# Patient Record
Sex: Male | Born: 1935 | Race: White | Hispanic: No | Marital: Married | Smoking: Never smoker
Health system: Southern US, Community
[De-identification: ages and names within clinical notes are randomized; demographics above are authoritative.]

## PROBLEM LIST (undated history)

## (undated) DIAGNOSIS — K219 Gastro-esophageal reflux disease without esophagitis: Secondary | ICD-10-CM

## (undated) DIAGNOSIS — I10 Essential (primary) hypertension: Secondary | ICD-10-CM

## (undated) DIAGNOSIS — M1990 Unspecified osteoarthritis, unspecified site: Secondary | ICD-10-CM

## (undated) DIAGNOSIS — E079 Disorder of thyroid, unspecified: Secondary | ICD-10-CM

## (undated) HISTORY — DX: Unspecified osteoarthritis, unspecified site: M19.90

## (undated) HISTORY — PX: PACEMAKER INSERTION: SHX728

## (undated) HISTORY — DX: Gastro-esophageal reflux disease without esophagitis: K21.9

## (undated) HISTORY — PX: TOOTH EXTRACTION: SUR596

## (undated) HISTORY — DX: Disorder of thyroid, unspecified: E07.9

## (undated) HISTORY — DX: Essential (primary) hypertension: I10

## (undated) HISTORY — PX: ABLATION: SHX5711

---

## 1997-11-08 ENCOUNTER — Inpatient Hospital Stay (HOSPITAL_COMMUNITY): Admission: AD | Admit: 1997-11-08 | Discharge: 1997-11-11 | Payer: Self-pay | Admitting: Gastroenterology

## 1997-11-26 ENCOUNTER — Ambulatory Visit (HOSPITAL_COMMUNITY): Admission: RE | Admit: 1997-11-26 | Discharge: 1997-11-26 | Payer: Self-pay | Admitting: Gastroenterology

## 1998-06-18 ENCOUNTER — Inpatient Hospital Stay (HOSPITAL_COMMUNITY): Admission: EM | Admit: 1998-06-18 | Discharge: 1998-06-22 | Payer: Self-pay | Admitting: Emergency Medicine

## 1998-06-18 ENCOUNTER — Encounter: Payer: Self-pay | Admitting: Gastroenterology

## 1998-06-19 ENCOUNTER — Encounter: Payer: Self-pay | Admitting: Gastroenterology

## 2000-06-24 ENCOUNTER — Ambulatory Visit (HOSPITAL_COMMUNITY): Admission: RE | Admit: 2000-06-24 | Discharge: 2000-06-24 | Payer: Self-pay | Admitting: Gastroenterology

## 2002-12-13 ENCOUNTER — Ambulatory Visit (HOSPITAL_COMMUNITY): Admission: RE | Admit: 2002-12-13 | Discharge: 2002-12-13 | Payer: Self-pay | Admitting: Gastroenterology

## 2004-07-06 ENCOUNTER — Encounter: Admission: RE | Admit: 2004-07-06 | Discharge: 2004-07-06 | Payer: Self-pay | Admitting: Gastroenterology

## 2005-04-05 ENCOUNTER — Ambulatory Visit (HOSPITAL_COMMUNITY): Admission: RE | Admit: 2005-04-05 | Discharge: 2005-04-05 | Payer: Self-pay | Admitting: Gastroenterology

## 2012-09-14 ENCOUNTER — Other Ambulatory Visit: Payer: Self-pay | Admitting: Gastroenterology

## 2013-03-27 ENCOUNTER — Ambulatory Visit (INDEPENDENT_AMBULATORY_CARE_PROVIDER_SITE_OTHER): Payer: Medicare Other

## 2013-03-27 VITALS — BP 145/73 | HR 64 | Resp 16 | Ht 69.0 in | Wt 233.0 lb

## 2013-03-27 DIAGNOSIS — M79609 Pain in unspecified limb: Secondary | ICD-10-CM

## 2013-03-27 DIAGNOSIS — B351 Tinea unguium: Secondary | ICD-10-CM

## 2013-03-27 NOTE — Progress Notes (Signed)
  Subjective:    Patient ID: Nathan Vazquez, male    DOB: Aug 16, 1931, 79 y.o.   MRN: [id number]  "Just trim my toenails, that's about it."  HPI Comments: N  Painful, Thick L  Debridement B/L D 1 week ago O  Gradually  C  Gotten worse A  Get too long T  Attempted to cut them myself without much success      Review of Systems  Genitourinary: Positive for frequency and difficulty urinating.  Musculoskeletal:       Joint pain  Hematological: Bruises/bleeds easily.  Psychiatric/Behavioral: The patient is nervous/anxious.   All other systems reviewed and are negative.       Objective:   Physical Exam Neurovascular status is intact although minimally diminished DP pulses +2/4 PT one over 4 bilateral Refill timed 3-4 seconds all digits skin temperature warm turgor diminished no edema rubor pallor mild varicosities noted. Epicritic and Percocet to sensations intact and symmetric bilateral. Normal plantar response DTRs not listed dermatologic the skin color pigment normal nails thick brittle friable criptotic incurvated most notable hallux nails bilateral painful tender and symptomatic with friability and discomfort with enclosed shoe wear and ambulation. Remaining nails also show thickening and keratosis incurvation. Orthopedic exam unremarkable for digital contractures hammertoes 2 through 5. No open wounds or ulcerations are noted.       Assessment & Plan:  Assessment this time is onychomycosis thick and brittle friable mycotic nails debrided x10. The hallux second and third digits bilateral painful tender and symptomatic and friable consistent with onychomycosis. Nails debrided at this time also recommended topical antifungal either Fungi-Nail or formula 3 which to be obtained here applied twice daily to the affected nails for 12 months duration. Recheck in 3 months for continued palliative care is needed  Alvan Dame DPM

## 2013-03-27 NOTE — Patient Instructions (Signed)
Onychomycosis/Fungal Toenails  WHAT IS IT? An infection that lies within the keratin of your nail plate that is caused by a fungus.  WHY ME? Fungal infections affect all ages, sexes, races, and creeds.  There may be many factors that predispose you to a fungal infection such as age, coexisting medical conditions such as diabetes, or an autoimmune disease; stress, medications, fatigue, genetics, etc.  Bottom line: fungus thrives in a warm, moist environment and your shoes offer such a location.  IS IT CONTAGIOUS? Theoretically, yes.  You do not want to share shoes, nail clippers or files with someone who has fungal toenails.  Walking around barefoot in the same room or sleeping in the same bed is unlikely to transfer the organism.  It is important to realize, however, that fungus can spread easily from one nail to the next on the same foot.  HOW DO WE TREAT THIS?  There are several ways to treat this condition.  Treatment may depend on many factors such as age, medications, pregnancy, liver and kidney conditions, etc.  It is best to ask your doctor which options are available to you.  1. No treatment.   Unlike many other medical concerns, you can live with this condition.  However for many people this can be a painful condition and may lead to ingrown toenails or a bacterial infection.  It is recommended that you keep the nails cut short to help reduce the amount of fungal nail. 2. Topical treatment.  These range from herbal remedies to prescription strength nail lacquers.  About 40-50% effective, topicals require twice daily application for approximately 9 to 12 months or until an entirely new nail has grown out.  The most effective topicals are medical grade medications available through physicians offices. 3. Oral antifungal medications.  With an 80-90% cure rate, the most common oral medication requires 3 to 4 months of therapy and stays in your system for a year as the new nail grows out.  Oral  antifungal medications do require blood work to make sure it is a safe drug for you.  A liver function panel will be performed prior to starting the medication and after the first month of treatment.  It is important to have the blood work performed to avoid any harmful side effects.  In general, this medication safe but blood work is required. 4. Laser Therapy.  This treatment is performed by applying a specialized laser to the affected nail plate.  This therapy is noninvasive, fast, and non-painful.  It is not covered by insurance and is therefore, out of pocket.  The results have been very good with a 80-95% cure rate.  The [facility] is the only practice in the area to offer this therapy. 5. Permanent Nail Avulsion.  Removing the entire nail so that a new nail will not grow back. 6. Formula 3 which can be obtained at the office 3 applied twice daily to the affected nails for 12 months duration. Calces Fungi-Nail which can be obtained over-the-counter.

## 2018-05-10 ENCOUNTER — Other Ambulatory Visit: Payer: Self-pay | Admitting: Gastroenterology

## 2018-05-10 DIAGNOSIS — R1311 Dysphagia, oral phase: Secondary | ICD-10-CM

## 2018-05-11 ENCOUNTER — Ambulatory Visit
Admission: RE | Admit: 2018-05-11 | Discharge: 2018-05-11 | Disposition: A | Discharge status: Home / Self Care | Payer: Commercial Managed Care - HMO | Source: Ambulatory Visit | Attending: Gastroenterology | Admitting: Gastroenterology

## 2018-05-11 DIAGNOSIS — R1311 Dysphagia, oral phase: Secondary | ICD-10-CM

## 2018-05-17 ENCOUNTER — Other Ambulatory Visit: Payer: Self-pay | Admitting: Gastroenterology

## 2018-05-17 ENCOUNTER — Ambulatory Visit
Admission: RE | Admit: 2018-05-17 | Discharge: 2018-05-17 | Disposition: A | Discharge status: Home / Self Care | Payer: Commercial Managed Care - HMO | Source: Ambulatory Visit | Attending: Gastroenterology | Admitting: Gastroenterology

## 2018-05-17 DIAGNOSIS — R1311 Dysphagia, oral phase: Secondary | ICD-10-CM

## 2018-05-23 ENCOUNTER — Other Ambulatory Visit (HOSPITAL_COMMUNITY): Payer: Self-pay

## 2018-05-23 DIAGNOSIS — R1310 Dysphagia, unspecified: Secondary | ICD-10-CM

## 2018-06-09 ENCOUNTER — Ambulatory Visit (HOSPITAL_COMMUNITY)
Admission: RE | Admit: 2018-06-09 | Discharge: 2018-06-09 | Disposition: A | Discharge status: Home / Self Care | Payer: Medicare HMO | Source: Ambulatory Visit | Attending: Gastroenterology | Admitting: Gastroenterology

## 2018-06-09 DIAGNOSIS — R1310 Dysphagia, unspecified: Secondary | ICD-10-CM | POA: Diagnosis present

## 2018-06-09 NOTE — Progress Notes (Signed)
Modified Barium Swallow Progress Note  Patient Details  Name: Nathan Vazquez MRN: [id number] Date of Birth: [birth year]  Today's Date: 06/09/2018  Modified Barium Swallow completed.  Full report located under Chart Review in the Imaging Section.  Brief recommendations include the following:  Clinical Impression  Pt was seen in radiology suite for modified barium swallow study. He was seated upright in swallow chair for the study which was conducted in lateral view. Trials of puree, mechanical soft solids, thin liquids, nectar thick liquids, and a 62mm barium tablet in thin liquid were administered. He presents with moderate-severe pharyngeal phase dysphagia. He exhibited with reduced laryngeal excursion which resulted in incomplete epiglottic retroversion, and pyriform sinus residue across all consistencies. Trace pharyneal wall residue was noted due to reduced pharyngeal constriction. Penetration was demonstrated with idividual sips of thin liquids and nectar thick liquids, and aspiraton was observed with consecutive swallows secondary to incomplete epiglottic retroversion. Pt also presented with a curved epiglottis which, combined with the noted incomplete epiglottic retroversion, facilitated vallecular retention of solids. A liquid wash was effective in reducing pharyngeal residue. A chin tuck posture combined with a left head turn effectively eliminated penetration and aspiration. Coughing was noted following instances of aspiration but was ineffective in expellig the aspirant. A dysphagia 3 diet with thin liquids is recommended at this time with use of compensatory strategies to reduce aspiration risk. However, dysphagia intervention is clinically indicated to improve swallow function and reduce aspiration risk. Pt and his daughter were educated regarding the results of the study, diet recommendations, swallowing precautions, and the need for dysphagia intervention. Both parties verbalized  understanding as well as agreement with all areas of education.   Swallow Evaluation Recommendations       SLP Diet Recommendations: Dysphagia 3 (Mech soft) solids;Thin liquid   Liquid Administration via: No straw;Cup   Medication Administration: Whole meds with puree   Supervision: Intermittent supervision to cue for compensatory strategies   Compensations: Slow rate;Small sips/bites;Other (Comment);Follow solids with liquid;Chin tuck(chin tuck posture with left head turn )   Postural Changes: Remain semi-upright after after feeds/meals (Comment);Seated upright at 90 degrees   Oral Care Recommendations: Oral care BID      Danaysha Kirn I. Vear Clock, MS, CCC-SLP Acute Rehabilitation Services Office number [phone] Pager [phone]  Scheryl Marten 06/09/2018,3:08 PM

## 2018-06-29 ENCOUNTER — Other Ambulatory Visit: Payer: Self-pay | Admitting: Gastroenterology

## 2018-06-29 DIAGNOSIS — R1084 Generalized abdominal pain: Secondary | ICD-10-CM

## 2018-06-29 DIAGNOSIS — R110 Nausea: Secondary | ICD-10-CM

## 2018-06-29 DIAGNOSIS — R634 Abnormal weight loss: Secondary | ICD-10-CM

## 2018-07-06 ENCOUNTER — Ambulatory Visit
Admission: RE | Admit: 2018-07-06 | Discharge: 2018-07-06 | Disposition: A | Discharge status: Home / Self Care | Payer: Medicare HMO | Source: Ambulatory Visit | Attending: Gastroenterology | Admitting: Gastroenterology

## 2018-07-06 DIAGNOSIS — R1084 Generalized abdominal pain: Secondary | ICD-10-CM

## 2018-07-06 DIAGNOSIS — R634 Abnormal weight loss: Secondary | ICD-10-CM

## 2018-07-06 DIAGNOSIS — R110 Nausea: Secondary | ICD-10-CM

## 2018-07-06 MED ORDER — IOPAMIDOL (ISOVUE-300) INJECTION 61%
100.0000 mL | Freq: Once | INTRAVENOUS | Status: AC | PRN
  Administered 2018-07-06: 100 mL via INTRAVENOUS

## 2018-12-05 ENCOUNTER — Ambulatory Visit: Payer: Medicare HMO | Admitting: Speech Pathology

## 2019-05-08 ENCOUNTER — Other Ambulatory Visit: Payer: Self-pay

## 2019-05-08 ENCOUNTER — Encounter (HOSPITAL_COMMUNITY): Payer: Self-pay | Admitting: Emergency Medicine

## 2019-05-08 ENCOUNTER — Emergency Department (HOSPITAL_COMMUNITY): Payer: Medicare HMO

## 2019-05-08 ENCOUNTER — Inpatient Hospital Stay (HOSPITAL_COMMUNITY)
Admission: EM | Admit: 2019-05-08 | Discharge: 2019-05-12 | DRG: 177 | Disposition: A | Discharge status: Home Health | Payer: Medicare HMO | Source: Ambulatory Visit | Attending: Student | Admitting: Student

## 2019-05-08 DIAGNOSIS — E860 Dehydration: Secondary | ICD-10-CM | POA: Diagnosis present

## 2019-05-08 DIAGNOSIS — K219 Gastro-esophageal reflux disease without esophagitis: Secondary | ICD-10-CM | POA: Diagnosis present

## 2019-05-08 DIAGNOSIS — I951 Orthostatic hypotension: Secondary | ICD-10-CM | POA: Diagnosis present

## 2019-05-08 DIAGNOSIS — J1282 Pneumonia due to coronavirus disease 2019: Secondary | ICD-10-CM | POA: Diagnosis present

## 2019-05-08 DIAGNOSIS — I4821 Permanent atrial fibrillation: Secondary | ICD-10-CM | POA: Diagnosis not present

## 2019-05-08 DIAGNOSIS — U071 COVID-19: Principal | ICD-10-CM | POA: Diagnosis present

## 2019-05-08 DIAGNOSIS — Z820 Family history of epilepsy and other diseases of the nervous system: Secondary | ICD-10-CM | POA: Diagnosis not present

## 2019-05-08 DIAGNOSIS — J069 Acute upper respiratory infection, unspecified: Secondary | ICD-10-CM | POA: Diagnosis present

## 2019-05-08 DIAGNOSIS — G20 Parkinson's disease: Secondary | ICD-10-CM | POA: Diagnosis present

## 2019-05-08 DIAGNOSIS — R5381 Other malaise: Secondary | ICD-10-CM | POA: Diagnosis not present

## 2019-05-08 DIAGNOSIS — Z8551 Personal history of malignant neoplasm of bladder: Secondary | ICD-10-CM | POA: Diagnosis not present

## 2019-05-08 DIAGNOSIS — Z8546 Personal history of malignant neoplasm of prostate: Secondary | ICD-10-CM

## 2019-05-08 DIAGNOSIS — I10 Essential (primary) hypertension: Secondary | ICD-10-CM | POA: Diagnosis present

## 2019-05-08 DIAGNOSIS — E039 Hypothyroidism, unspecified: Secondary | ICD-10-CM | POA: Diagnosis present

## 2019-05-08 DIAGNOSIS — Z950 Presence of cardiac pacemaker: Secondary | ICD-10-CM

## 2019-05-08 DIAGNOSIS — Z7901 Long term (current) use of anticoagulants: Secondary | ICD-10-CM

## 2019-05-08 DIAGNOSIS — Z8679 Personal history of other diseases of the circulatory system: Secondary | ICD-10-CM

## 2019-05-08 DIAGNOSIS — I482 Chronic atrial fibrillation, unspecified: Secondary | ICD-10-CM | POA: Diagnosis not present

## 2019-05-08 DIAGNOSIS — I495 Sick sinus syndrome: Secondary | ICD-10-CM | POA: Diagnosis present

## 2019-05-08 DIAGNOSIS — D638 Anemia in other chronic diseases classified elsewhere: Secondary | ICD-10-CM | POA: Diagnosis present

## 2019-05-08 DIAGNOSIS — I4819 Other persistent atrial fibrillation: Secondary | ICD-10-CM | POA: Diagnosis present

## 2019-05-08 DIAGNOSIS — K2100 Gastro-esophageal reflux disease with esophagitis, without bleeding: Secondary | ICD-10-CM

## 2019-05-08 DIAGNOSIS — N400 Enlarged prostate without lower urinary tract symptoms: Secondary | ICD-10-CM | POA: Diagnosis present

## 2019-05-08 DIAGNOSIS — R55 Syncope and collapse: Secondary | ICD-10-CM | POA: Diagnosis present

## 2019-05-08 DIAGNOSIS — I959 Hypotension, unspecified: Secondary | ICD-10-CM | POA: Diagnosis present

## 2019-05-08 DIAGNOSIS — G2581 Restless legs syndrome: Secondary | ICD-10-CM | POA: Diagnosis present

## 2019-05-08 DIAGNOSIS — R531 Weakness: Secondary | ICD-10-CM | POA: Diagnosis not present

## 2019-05-08 LAB — CBC WITH DIFFERENTIAL/PLATELET
Abs Immature Granulocytes: 0.02 10*3/uL (ref 0.00–0.07)
Basophils Absolute: 0 10*3/uL (ref 0.0–0.1)
Basophils Relative: 0 %
Eosinophils Absolute: 0 10*3/uL (ref 0.0–0.5)
Eosinophils Relative: 1 %
HCT: 33.2 % — ABNORMAL LOW (ref 39.0–52.0)
Hemoglobin: 10.7 g/dL — ABNORMAL LOW (ref 13.0–17.0)
Immature Granulocytes: 1 %
Lymphocytes Relative: 15 %
Lymphs Abs: 0.6 10*3/uL — ABNORMAL LOW (ref 0.7–4.0)
MCH: 30.8 pg (ref 26.0–34.0)
MCHC: 32.2 g/dL (ref 30.0–36.0)
MCV: 95.7 fL (ref 80.0–100.0)
Monocytes Absolute: 0.4 10*3/uL (ref 0.1–1.0)
Monocytes Relative: 10 %
Neutro Abs: 3.1 10*3/uL (ref 1.7–7.7)
Neutrophils Relative %: 73 %
Platelets: 149 10*3/uL — ABNORMAL LOW (ref 150–400)
RBC: 3.47 MIL/uL — ABNORMAL LOW (ref 4.22–5.81)
RDW: 12.6 % (ref 11.5–15.5)
WBC: 4.2 10*3/uL (ref 4.0–10.5)
nRBC: 0 % (ref 0.0–0.2)

## 2019-05-08 LAB — COMPREHENSIVE METABOLIC PANEL
ALT: 14 U/L (ref 0–44)
AST: 17 U/L (ref 15–41)
Albumin: 3.7 g/dL (ref 3.5–5.0)
Alkaline Phosphatase: 76 U/L (ref 38–126)
Anion gap: 8 (ref 5–15)
BUN: 19 mg/dL (ref 8–23)
CO2: 27 mmol/L (ref 22–32)
Calcium: 8.4 mg/dL — ABNORMAL LOW (ref 8.9–10.3)
Chloride: 102 mmol/L (ref 98–111)
Creatinine, Ser: 1.09 mg/dL (ref 0.61–1.24)
GFR calc Af Amer: >60 mL/min (ref >60)
GFR calc non Af Amer: >60 mL/min (ref >60)
Glucose, Bld: 100 mg/dL — ABNORMAL HIGH (ref 70–99)
Potassium: 4.7 mmol/L (ref 3.5–5.1)
Sodium: 137 mmol/L (ref 135–145)
Total Bilirubin: 1.1 mg/dL (ref 0.3–1.2)
Total Protein: 6.3 g/dL — ABNORMAL LOW (ref 6.5–8.1)

## 2019-05-08 LAB — TRIGLYCERIDES: Triglycerides: 82 mg/dL (ref <150)

## 2019-05-08 LAB — URINALYSIS, ROUTINE W REFLEX MICROSCOPIC
Bacteria, UA: NONE SEEN
Bilirubin Urine: NEGATIVE
Glucose, UA: NEGATIVE mg/dL
Ketones, ur: NEGATIVE mg/dL
Leukocytes,Ua: NEGATIVE
Nitrite: NEGATIVE
Protein, ur: NEGATIVE mg/dL
Specific Gravity, Urine: 1.006 (ref 1.005–1.030)
pH: 7 (ref 5.0–8.0)

## 2019-05-08 LAB — LACTATE DEHYDROGENASE: LDH: 113 U/L (ref 98–192)

## 2019-05-08 LAB — POC SARS CORONAVIRUS 2 AG -  ED: SARS Coronavirus 2 Ag: POSITIVE — AB

## 2019-05-08 LAB — PROTIME-INR
INR: 1.6 — ABNORMAL HIGH (ref 0.8–1.2)
Prothrombin Time: 18.8 seconds — ABNORMAL HIGH (ref 11.4–15.2)

## 2019-05-08 LAB — LACTIC ACID, PLASMA: Lactic Acid, Venous: 0.9 mmol/L (ref 0.5–1.9)

## 2019-05-08 LAB — D-DIMER, QUANTITATIVE: D-Dimer, Quant: 0.33 ug/mL-FEU (ref 0.00–0.50)

## 2019-05-08 LAB — FIBRINOGEN: Fibrinogen: 397 mg/dL (ref 210–475)

## 2019-05-08 LAB — PROCALCITONIN: Procalcitonin: <0.1 ng/mL

## 2019-05-08 LAB — TROPONIN I (HIGH SENSITIVITY)
Troponin I (High Sensitivity): 6 ng/L (ref <18)
Troponin I (High Sensitivity): 6 ng/L (ref <18)

## 2019-05-08 MED ORDER — ROPINIROLE HCL 1 MG PO TABS
0.5000 mg | ORAL_TABLET | Freq: Three times a day (TID) | ORAL | Status: DC
  Administered 2019-05-08 – 2019-05-12 (×11): 0.5 mg via ORAL
  Filled 2019-05-08 (×13): qty 1

## 2019-05-08 MED ORDER — SODIUM CHLORIDE 0.9 % IV SOLN
100.0000 mg | Freq: Every day | INTRAVENOUS | Status: AC
  Administered 2019-05-09 – 2019-05-12 (×4): 100 mg via INTRAVENOUS
  Filled 2019-05-08 (×4): qty 100

## 2019-05-08 MED ORDER — ASCORBIC ACID 500 MG PO TABS
500.0000 mg | ORAL_TABLET | Freq: Every day | ORAL | Status: DC
  Administered 2019-05-08 – 2019-05-12 (×4): 500 mg via ORAL
  Filled 2019-05-08 (×5): qty 1

## 2019-05-08 MED ORDER — TAMSULOSIN HCL 0.4 MG PO CAPS
0.4000 mg | ORAL_CAPSULE | Freq: Every day | ORAL | Status: DC
  Administered 2019-05-08 – 2019-05-12 (×5): 0.4 mg via ORAL
  Filled 2019-05-08 (×5): qty 1

## 2019-05-08 MED ORDER — GUAIFENESIN-DM 100-10 MG/5ML PO SYRP
10.0000 mL | ORAL_SOLUTION | ORAL | Status: DC | PRN
  Administered 2019-05-10: 21:00:00 10 mL via ORAL
  Filled 2019-05-08: qty 10

## 2019-05-08 MED ORDER — ALBUTEROL SULFATE HFA 108 (90 BASE) MCG/ACT IN AERS
2.0000 | INHALATION_SPRAY | Freq: Four times a day (QID) | RESPIRATORY_TRACT | Status: DC
  Administered 2019-05-08 – 2019-05-12 (×12): 2 via RESPIRATORY_TRACT
  Filled 2019-05-08 (×3): qty 6.7

## 2019-05-08 MED ORDER — ZINC SULFATE 220 (50 ZN) MG PO CAPS
220.0000 mg | ORAL_CAPSULE | Freq: Every day | ORAL | Status: DC
  Administered 2019-05-08 – 2019-05-12 (×4): 220 mg via ORAL
  Filled 2019-05-08 (×5): qty 1

## 2019-05-08 MED ORDER — APIXABAN 5 MG PO TABS
5.0000 mg | ORAL_TABLET | Freq: Two times a day (BID) | ORAL | Status: DC
  Administered 2019-05-08 – 2019-05-12 (×8): 5 mg via ORAL
  Filled 2019-05-08 (×9): qty 1

## 2019-05-08 MED ORDER — SODIUM CHLORIDE 0.9 % IV SOLN
200.0000 mg | Freq: Once | INTRAVENOUS | Status: AC
  Administered 2019-05-08: 200 mg via INTRAVENOUS
  Filled 2019-05-08: qty 200

## 2019-05-08 MED ORDER — DILTIAZEM HCL ER COATED BEADS 180 MG PO CP24
180.0000 mg | ORAL_CAPSULE | Freq: Every day | ORAL | Status: DC
  Administered 2019-05-09 – 2019-05-12 (×4): 180 mg via ORAL
  Filled 2019-05-08 (×4): qty 1

## 2019-05-08 MED ORDER — DIPHENHYDRAMINE HCL 12.5 MG/5ML PO ELIX
6.2500 mg | ORAL_SOLUTION | Freq: Once | ORAL | Status: AC
  Administered 2019-05-08: 23:00:00 6.25 mg via ORAL
  Filled 2019-05-08: qty 5

## 2019-05-08 MED ORDER — SODIUM CHLORIDE 0.9 % IV SOLN: 200.0000 mg | Freq: Once | INTRAVENOUS | Status: DC

## 2019-05-08 MED ORDER — SODIUM CHLORIDE 0.9 % IV SOLN: INTRAVENOUS | Status: DC

## 2019-05-08 MED ORDER — PANTOPRAZOLE SODIUM 40 MG PO TBEC
40.0000 mg | DELAYED_RELEASE_TABLET | Freq: Every day | ORAL | Status: DC
  Administered 2019-05-08 – 2019-05-12 (×5): 40 mg via ORAL
  Filled 2019-05-08 (×5): qty 1

## 2019-05-08 MED ORDER — SODIUM CHLORIDE 0.9 % IV SOLN: 100.0000 mg | Freq: Every day | INTRAVENOUS | Status: DC

## 2019-05-08 NOTE — ED Triage Notes (Signed)
Arrives via EMS from the urgent care, lives at home. C/C weakness, lethargy and fatigue x3 weeks, lives with his wife. Caretakers are his sons, they are COVID +. Had an appt with urgent care today and initial BP was 82/54, UC placed an 18 LAC, gave 1L bolus NS, with EMS BP was 122/76, pacemaker with a hx of a-fib.

## 2019-05-08 NOTE — ED Provider Notes (Signed)
[place] DEPT Provider Note   CSN: [id number] Arrival date & time: 05/08/19  1228     History Chief Complaint  Patient presents with  . Fatigue    Nathan Vazquez is a 86 y.o. male.  Patient with a history of pacemaker 2/2 sick sinus syndrome, hypertension, Parkinson disease, A. fib on anticoagulation --presents to the emergency department today with hypotension and generalized weakness.  Patient states that he is currently living at home.  Patient has contact with family members who are Covid positive.  Patient has had increasing weakness, lethargy, and fatigue over the past 3 weeks.  Patient went to urgent care today and was found to have a blood pressure of 82/54.  EMS was called and patient was treated with 1 L of normal saline with improvement in blood pressure.  From Urgent Care:  86 y.o. male who has a past medical history of arthritis, A. fib on Eliquis, bladder cancer and prostate cancer, HTN, Parkinson's disease, SSS s/p PPM presenting to clinic today with 3 weeks of fatigue, weakness, body aches, and productive cough, in the setting of known Covid positive exposure. Patient is lethargic on exam, falling asleep during interview, feels "miserable", and was found to be hypotensive at 82/54 on manual blood pressure check. Blood sugar 85. IV placed and 1 L normal saline given.        Past Medical History:  Diagnosis Date  . Arthritis   . GERD (gastroesophageal reflux disease)   . Hypertension   . Thyroid disease     There are no problems to display for this patient.   Past Surgical History:  Procedure Laterality Date  . ABLATION     Heart  . PACEMAKER INSERTION    . TOOTH EXTRACTION Bilateral        Family History  Problem Relation Age of Onset  . Alzheimer's disease Father     Social History   Tobacco Use  . Smoking status: Never Smoker  Substance Use Topics  . Alcohol use: No  . Drug use: No    Home  Medications Prior to Admission medications   Medication Sig Start Date End Date Taking? Authorizing Provider  amiodarone (PACERONE) 200 MG tablet  03/18/13   [provider]  amLODipine (NORVASC) 10 MG tablet  03/18/13   [provider]  digoxin (LANOXIN) 0.125 MG tablet  03/09/13   [provider]  levothyroxine (SYNTHROID, LEVOTHROID) 50 MCG tablet  02/23/13   [provider]  meloxicam (MOBIC) 15 MG tablet  03/18/13   [provider]  omeprazole (PRILOSEC) 40 MG capsule  03/18/13   [provider]  warfarin (COUMADIN) 4 MG tablet  03/02/13   [provider]    Allergies    Patient has no known allergies.  Review of Systems   Review of Systems  Constitutional: Positive for fatigue. Negative for fever.  HENT: Negative for rhinorrhea and sore throat.   Eyes: Negative for redness.  Respiratory: Positive for cough. Negative for shortness of breath.   Cardiovascular: Negative for chest pain.  Gastrointestinal: Negative for abdominal pain, diarrhea, nausea and vomiting.  Genitourinary: Negative for dysuria.  Musculoskeletal: Negative for myalgias.  Skin: Negative for rash.  Neurological: Positive for weakness and light-headedness. Negative for headaches.    Physical Exam Updated Vital Signs BP 127/80 (BP Location: Right Arm)   Pulse 71   Temp 98.6 F (37 C) (Oral)   Resp 12   Ht 5\' 10"  (1.778 m)  Wt 69.9 kg   SpO2 97%   BMI 22.10 kg/m   Physical Exam Vitals and nursing note reviewed.  Constitutional:      Appearance: He is well-developed.  HENT:     Head: Normocephalic and atraumatic.     Mouth/Throat:     Mouth: Mucous membranes are dry.  Eyes:     General:        Right eye: No discharge.        Left eye: No discharge.     Conjunctiva/sclera: Conjunctivae normal.  Cardiovascular:     Rate and Rhythm: Normal rate and regular rhythm.     Heart sounds: Normal heart sounds.  Pulmonary:     Effort:  Pulmonary effort is normal.     Breath sounds: Normal breath sounds.  Abdominal:     Palpations: Abdomen is soft.     Tenderness: There is no abdominal tenderness. There is no guarding or rebound.  Musculoskeletal:     Cervical back: Normal range of motion and neck supple.  Skin:    General: Skin is warm and dry.  Neurological:     General: No focal deficit present.     Mental Status: He is alert. Mental status is at baseline.     Comments: Speech is garbled.  Patient moves all extremities but with decreased strength and generalized weakness.     ED Results / Procedures / Treatments   Labs (all labs ordered are listed, but only abnormal results are displayed) Labs Reviewed  CBC WITH DIFFERENTIAL/PLATELET - Abnormal; Notable for the following components:      Result Value   RBC 3.47 (*)    Hemoglobin 10.7 (*)    HCT 33.2 (*)    Platelets 149 (*)    Lymphs Abs 0.6 (*)    All other components within normal limits  COMPREHENSIVE METABOLIC PANEL - Abnormal; Notable for the following components:   Glucose, Bld 100 (*)    Calcium 8.4 (*)    Total Protein 6.3 (*)    All other components within normal limits  PROTIME-INR - Abnormal; Notable for the following components:   Prothrombin Time 18.8 (*)    INR 1.6 (*)    All other components within normal limits  POC SARS CORONAVIRUS 2 AG -  ED - Abnormal; Notable for the following components:   SARS Coronavirus 2 Ag POSITIVE (*)    All other components within normal limits  URINE CULTURE  CULTURE, BLOOD (ROUTINE X 2)  CULTURE, BLOOD (ROUTINE X 2)  LACTIC ACID, PLASMA  D-DIMER, QUANTITATIVE (NOT AT [facility])  PROCALCITONIN  LACTATE DEHYDROGENASE  TRIGLYCERIDES  FIBRINOGEN  URINALYSIS, ROUTINE W REFLEX MICROSCOPIC  LACTIC ACID, PLASMA  FERRITIN  C-REACTIVE PROTEIN  TROPONIN I (HIGH SENSITIVITY)  TROPONIN I (HIGH SENSITIVITY)    EKG EKG Interpretation  Date/Time:  Tuesday May 08 2019 13:00:20 EST Ventricular Rate:  70 PR  Interval:    QRS Duration: 169 QT Interval:  425 QTC Calculation: 459 R Axis:   -88 Text Interpretation: Atrial fibrillation Nonspecific IVCD with LAD Left ventricular hypertrophy No significant change since last tracing Confirmed by Melene Plan [phone]) on 05/08/2019 3:16:08 PM   Radiology DG Chest Port 1 View  Result Date: 05/08/2019 CLINICAL DATA:  Fatigue and weakness for the past 3 weeks. COVID exposure. EXAM: PORTABLE CHEST 1 VIEW COMPARISON:  Chest x-ray dated October 24, 2018. FINDINGS: Unchanged left chest wall pacemaker. Stable mild cardiomegaly. Atherosclerotic calcification of the aortic arch. Normal pulmonary vascularity. Minimal  bibasilar opacities. No pleural effusion or pneumothorax. No acute osseous abnormality. IMPRESSION: 1. Minimal bibasilar opacities which could reflect atelectasis or pneumonia, including atypical infection. Electronically Signed   By: Obie Dredge M.D.   On: 05/08/2019 14:35    Procedures Procedures (including critical care time)  Medications Ordered in ED Medications - No data to display  ED Course  I have reviewed the triage vital signs and the nursing notes.  Pertinent labs & imaging results that were available during my care of the patient were reviewed by me and considered in my medical decision making (see chart for details).    MDM Rules/Calculators/A&P                       Patient seen and examined. Work-up initiated. Medications ordered.   Vital signs reviewed and are as follows: BP 116/73   Pulse 73   Temp 98.6 F (37 C) (Oral)   Resp (!) 21   Ht 5\' 10"  (1.778 m)   Wt 69.9 kg   SpO2 99%   BMI 22.10 kg/m   1:47 PM COVID positive. Added on additional labs. Awaiting orthostatics and remainder of work-up.   2:42 PM CXR with mild covid pna. RN doing orthostatics and ambulation attempt now.   3:39 PM patient is severely weak and unsteady on his feet.  He cannot stand without assistance.  I spoke with the patient's daughter,  , by telephone.  She states that her parents can barely take care of themselves at baseline.  The patient's wife typically feeds him and assists him with ADLs.  Discussed that given patient's profound deconditioning from his coronavirus, feel that he should be admitted to the hospital.  She is in agreement.  Of note, patient's wife is currently in the emergency department with hypoxia and pneumonia in setting of coronavirus and is being admitted.  3:41 PM Dr. Jasmine December has spoken with hospitalist Dr. Adela Lank who will see and admit.   CLERANCE UMLAND was evaluated in Emergency Department on 05/08/2019 for the symptoms described in the history of present illness. He was evaluated in the context of the global COVID-19 pandemic, which necessitated consideration that the patient might be at risk for infection with the SARS-CoV-2 virus that causes COVID-19. Institutional protocols and algorithms that pertain to the evaluation of patients at risk for COVID-19 are in a state of rapid change based on information released by regulatory bodies including the CDC and federal and state organizations. These policies and algorithms were followed during the patient's care in the ED.    Final Clinical Impression(s) / ED Diagnoses Final diagnoses:  Near syncope  Pneumonia due to COVID-19 virus    Rx / DC Orders ED Discharge Orders    None       07/06/2019, PA-C 05/08/19 1541    07/06/19, DO 05/09/19 [phone]

## 2019-05-08 NOTE — ED Notes (Signed)
Patient unsteady on feet. Unable to ambulate with pulse ox.   Patient remained at 100% while standing for BP with assistance.

## 2019-05-08 NOTE — H&P (Signed)
History and Physical        Hospital Admission Note Date: 05/08/2019  Patient name: Nathan Vazquez Medical record number: [id number] Date of birth: 1931/10/21 Age: 86 y.o. Gender: male  PCP: Laqueta Due., MD    Patient coming from: Urgent care  I have reviewed all records in the [facility].    Chief Complaint:  Weakness, lethargy, fatigue for last 3 weeks.  Covid positive  HPI: Patient is a 86 year old male with history of hypertension, pacemaker secondary to sick sinus syndrome, Parkinson disease, chronic A. fib on anticoagulation was sent from urgent care today with generalized weakness and hypotension.  Patient and his wife both went to urgent care today to get checked out.  Patient's daughter has Covid and was taking her mother to the doctors appointments, other family members also has Covid.  Patient was experiencing increasing weakness, fatigue and lethargy over the past 2 weeks.  In the urgent care, patient was found to have BP of 82/54.  EMS was called and patient was brought to ED.  Patient reports no fevers or chills, nausea vomiting or diarrhea, just feeling very weak and dizzy. He does not report any acute shortness of breath. COVID-19 test positive Of note, patient's wife is also being admitted with similar symptoms.  ED work-up/course:  Temp 98.6, respiratory rate 16, BP 126/76, was orthostatic in ED and unsteady on ambulating.  BP dropped from lying 135/82 to standing 118/71. Sodium 137, BUN 19, creatinine 1.09, inflammatory markers normal, procalcitonin < 0.1 WBCs 4.2, hemoglobin 10.7, hematocrit 33.2 INR 1.6  Chest x-ray showed bibasilar opacities could reflect atelectasis or pneumonia including atypical infection  Review of Systems: Positives marked in 'bold' Constitutional: Denies fever, chills, diaphoresis, poor appetite and fatigue.  HEENT:  Denies photophobia, eye pain, redness, hearing loss, ear pain, congestion, sore throat, rhinorrhea, sneezing, mouth sores, trouble swallowing, neck pain, neck stiffness and tinnitus.   Respiratory: Denies SOB, DOE, cough, chest tightness,  and wheezing.   Cardiovascular: Denies chest pain, palpitations and leg swelling.  Gastrointestinal: Denies nausea, vomiting, abdominal pain, diarrhea, constipation, blood in stool and abdominal distention.  Genitourinary: Denies dysuria, urgency, frequency, hematuria, flank pain and difficulty urinating.  Musculoskeletal: Denies myalgias, back pain, joint swelling, arthralgias and gait problem.  Skin: Denies pallor, rash and wound.  Neurological: Very weak and unsteady, orthostatic and dizzy Hematological: Denies adenopathy. Easy bruising, personal or family bleeding history  Psychiatric/Behavioral: Denies suicidal ideation, mood changes, confusion, nervousness, sleep disturbance and agitation  Past Medical History: Past Medical History:  Diagnosis Date  . Arthritis   . GERD (gastroesophageal reflux disease)   . Hypertension   . Thyroid disease     Past Surgical History:  Procedure Laterality Date  . ABLATION     Heart  . PACEMAKER INSERTION    . TOOTH EXTRACTION Bilateral     Medications: Prior to Admission medications   Medication Sig Start Date End Date Taking? Authorizing Provider  geriatric multivitamins-minerals (ELDERTONIC/GEVRABON) LIQD Take 15 mLs by mouth 3 (three) times daily with meals. 07/31/18  Yes [provider]  amiodarone (PACERONE) 200 MG tablet  03/18/13   [provider]  amLODipine (NORVASC)  2.5 MG tablet Take 2.5 mg by mouth daily. 11/21/18   [provider]  digoxin (LANOXIN) 0.125 MG tablet  03/09/13   [provider]  diltiazem (CARDIZEM CD) 180 MG 24 hr capsule Take 180 mg by mouth daily. 03/29/19   [provider]  ELIQUIS 5 MG TABS tablet Take 5 mg by mouth 2 (two) times  daily. 05/06/19   [provider]  levothyroxine (SYNTHROID, LEVOTHROID) 50 MCG tablet  02/23/13   [provider]  LORazepam (ATIVAN) 0.5 MG tablet Take 0.5 mg by mouth every 8 (eight) hours as needed for agitation or anxiety. 04/05/19   [provider]  meloxicam (MOBIC) 15 MG tablet  03/18/13   [provider]  omeprazole (PRILOSEC) 40 MG capsule  03/18/13   [provider]  ondansetron (ZOFRAN-ODT) 4 MG disintegrating tablet Take 4 mg by mouth every 8 (eight) hours as needed for nausea/vomiting. 03/12/19   [provider]  pramipexole (MIRAPEX) 0.125 MG tablet Take 0.125 mg by mouth 3 (three) times daily. 05/07/19   [provider]  rOPINIRole (REQUIP) 0.5 MG tablet Take 0.5 mg by mouth 3 (three) times daily. 01/20/19   [provider]  tamsulosin (FLOMAX) 0.4 MG CAPS capsule Take 0.4 mg by mouth daily. 11/21/18   [provider]  telmisartan (MICARDIS) 80 MG tablet Take 80 mg by mouth daily. 02/05/19   [provider]  Vitamin D, Ergocalciferol, (DRISDOL) 1.25 MG (50000 UT) CAPS capsule Take 50,000 Units by mouth once a week. 01/22/19   [provider]  warfarin (COUMADIN) 4 MG tablet  03/02/13   [provider]    Allergies:  No Known Allergies  Social History:  reports that he has never smoked. He does not have any smokeless tobacco history on file. He reports that he does not drink alcohol or use drugs.  Family History: Family History  Problem Relation Age of Onset  . Alzheimer's disease Father     Physical Exam: Blood pressure 134/88, pulse 71, temperature 98.6 F (37 C), temperature source Oral, resp. rate 17, height 5\' 10"  (1.778 m), weight 69.9 kg, SpO2 100 %. General: Alert, awake, oriented x3, in no acute distress. Eyes: pink conjunctiva,anicteric sclera, pupils equal and reactive to light and accomodation, HEENT: normocephalic, atraumatic, oropharynx clear Neck: supple, no  masses or lymphadenopathy, no goiter, no bruits, no JVD CVS: Regular rate and rhythm, without murmurs, rubs or gallops. No lower extremity edema Resp : Decreased breath sound at the bases GI : Soft, nontender, nondistended, positive bowel sounds, no masses. No hepatomegaly. No hernia.  Musculoskeletal: No clubbing or cyanosis, positive pedal pulses. No contracture. ROM intact  Neuro: Grossly intact, no focal neurological deficits, strength 5/5 upper and lower extremities bilaterally Psych: alert and oriented x 3, normal mood and affect Skin: no rashes or lesions, warm and dry   LABS on Admission: I have personally reviewed all the labs and imagings below    Basic Metabolic Panel: Recent Labs  Lab 05/08/19 1259  NA 137  K 4.7  CL 102  CO2 27  GLUCOSE 100*  BUN 19  CREATININE 1.09  CALCIUM 8.4*   Liver Function Tests: Recent Labs  Lab 05/08/19 1259  AST 17  ALT 14  ALKPHOS 76  BILITOT 1.1  PROT 6.3*  ALBUMIN 3.7   No results for input(s): LIPASE, AMYLASE in the last 168 hours. No results for input(s): AMMONIA in the last 168 hours. CBC: Recent Labs  Lab 05/08/19 1259  WBC 4.2  NEUTROABS 3.1  HGB 10.7*  HCT 33.2*  MCV 95.7  PLT 149*   Cardiac Enzymes: No results for input(s): CKTOTAL, CKMB, CKMBINDEX, TROPONINI in the last 168 hours. BNP: Invalid input(s): POCBNP CBG: No results for input(s): GLUCAP in the last 168 hours.  Radiological Exams on Admission:  DG Chest Port 1 View  Result Date: 05/08/2019 CLINICAL DATA:  Fatigue and weakness for the past 3 weeks. COVID exposure. EXAM: PORTABLE CHEST 1 VIEW COMPARISON:  Chest x-ray dated October 24, 2018. FINDINGS: Unchanged left chest wall pacemaker. Stable mild cardiomegaly. Atherosclerotic calcification of the aortic arch. Normal pulmonary vascularity. Minimal bibasilar opacities. No pleural effusion or pneumothorax. No acute osseous abnormality. IMPRESSION: 1. Minimal bibasilar opacities which could reflect  atelectasis or pneumonia, including atypical infection. Electronically Signed   By: Obie Dredge M.D.   On: 05/08/2019 14:35      EKG: Independently reviewed.  Rate 70, A. fib, paced rhythm*   Assessment/Plan Principal Problem:   Acute COVID-19 viral illness - Patient presented with orthostasis, dizziness, generalized fatigue and lethargy, no acute shortness of breath.  Chest x-ray shows bibasilar opacities atelectasis or pneumonia including atypical infection. - Inflammatory markers are low, awaiting CRP. - Currently no hypoxia, hold off on Decadron - Patient appears to be dehydrated and is orthostatic, will give 1 L fluid - will place on remdesivir per pharmacy protocol - Continue vitamin C, zinc, Tylenol, inhalers  - Will need PT OT evaluation  Active Problems:   Hypotension -Likely due to #1, poor oral appetite in the last 2 weeks, dehydration -Continue to hold amlodipine and Micardis -Will restart diltiazem in am     Atrial fibrillation, chronic (HCC) -Currently rate controlled, will restart Cardizem in a.m. -Restart Eliquis    Debility -PT OT evaluation    H/O sick sinus syndrome status post pacemaker    BPH (benign prostatic hyperplasia) -Continue Flomax    GERD (gastroesophageal reflux disease) -Continue PPI  History of Parkinson's disease, RLS -Continue Requip  DVT prophylaxis: Eliquis  CODE STATUS: Full CODE STATUS, discussed with the patient  Consults called: None  Family Communication: Admission, patients condition and plan of care including tests being ordered have been discussed with the patient and daughter, Mercie Eon who indicates understanding and agree with the plan and Code Status  Admission status: Inpatient telemetry  The medical decision making on this patient was of high complexity and the patient is at high risk for clinical deterioration, therefore this is a level 3 admission.  Severity of Illness:      The appropriate patient  status for this patient is INPATIENT. Inpatient status is judged to be reasonable and necessary in order to provide the required intensity of service to ensure the patient's safety. The patient's presenting symptoms, physical exam findings, and initial radiographic and laboratory data in the context of their chronic comorbidities is felt to place them at high risk for further clinical deterioration. Furthermore, it is not anticipated that the patient will be medically stable for discharge from the hospital within 2 midnights of admission. The following factors support the patient status of inpatient.   " The patient's presenting symptoms include orthostatic, hypotensive, generalized debility, Covid positive " The worrisome physical exam findings include orthostatic " The initial radiographic and laboratory data are worrisome because of chest x-ray with pneumonia " The chronic co-morbidities include chronic atrial fibrillation, hypertension, advanced age   * I certify that at the point of admission it is my clinical judgment  that the patient will require inpatient hospital care spanning beyond 2 midnights from the point of admission due to high intensity of service, high risk for further deterioration and high frequency of surveillance required.*    Time Spent on Admission: 70 minutes     Andora Krull M.D. [facility] 05/08/2019, 3:48 PM

## 2019-05-09 ENCOUNTER — Other Ambulatory Visit: Payer: Self-pay

## 2019-05-09 DIAGNOSIS — G20 Parkinson's disease: Secondary | ICD-10-CM

## 2019-05-09 DIAGNOSIS — D638 Anemia in other chronic diseases classified elsewhere: Secondary | ICD-10-CM

## 2019-05-09 DIAGNOSIS — I951 Orthostatic hypotension: Secondary | ICD-10-CM

## 2019-05-09 DIAGNOSIS — Z8679 Personal history of other diseases of the circulatory system: Secondary | ICD-10-CM

## 2019-05-09 LAB — COMPREHENSIVE METABOLIC PANEL
ALT: 14 U/L (ref 0–44)
AST: 17 U/L (ref 15–41)
Albumin: 3.3 g/dL — ABNORMAL LOW (ref 3.5–5.0)
Alkaline Phosphatase: 77 U/L (ref 38–126)
Anion gap: 11 (ref 5–15)
BUN: 16 mg/dL (ref 8–23)
CO2: 24 mmol/L (ref 22–32)
Calcium: 8.7 mg/dL — ABNORMAL LOW (ref 8.9–10.3)
Chloride: 102 mmol/L (ref 98–111)
Creatinine, Ser: 0.86 mg/dL (ref 0.61–1.24)
GFR calc Af Amer: >60 mL/min (ref >60)
GFR calc non Af Amer: >60 mL/min (ref >60)
Glucose, Bld: 84 mg/dL (ref 70–99)
Potassium: 4.4 mmol/L (ref 3.5–5.1)
Sodium: 137 mmol/L (ref 135–145)
Total Bilirubin: 0.7 mg/dL (ref 0.3–1.2)
Total Protein: 6.2 g/dL — ABNORMAL LOW (ref 6.5–8.1)

## 2019-05-09 LAB — CBC WITH DIFFERENTIAL/PLATELET
Abs Immature Granulocytes: 0.02 10*3/uL (ref 0.00–0.07)
Basophils Absolute: 0 10*3/uL (ref 0.0–0.1)
Basophils Relative: 0 %
Eosinophils Absolute: 0 10*3/uL (ref 0.0–0.5)
Eosinophils Relative: 1 %
HCT: 32.8 % — ABNORMAL LOW (ref 39.0–52.0)
Hemoglobin: 10.7 g/dL — ABNORMAL LOW (ref 13.0–17.0)
Immature Granulocytes: 1 %
Lymphocytes Relative: 21 %
Lymphs Abs: 0.8 10*3/uL (ref 0.7–4.0)
MCH: 30.7 pg (ref 26.0–34.0)
MCHC: 32.6 g/dL (ref 30.0–36.0)
MCV: 94.3 fL (ref 80.0–100.0)
Monocytes Absolute: 0.4 10*3/uL (ref 0.1–1.0)
Monocytes Relative: 11 %
Neutro Abs: 2.3 10*3/uL (ref 1.7–7.7)
Neutrophils Relative %: 66 %
Platelets: 159 10*3/uL (ref 150–400)
RBC: 3.48 MIL/uL — ABNORMAL LOW (ref 4.22–5.81)
RDW: 12.6 % (ref 11.5–15.5)
WBC: 3.5 10*3/uL — ABNORMAL LOW (ref 4.0–10.5)
nRBC: 0 % (ref 0.0–0.2)

## 2019-05-09 LAB — C-REACTIVE PROTEIN: CRP: 2.3 mg/dL — ABNORMAL HIGH (ref <1.0)

## 2019-05-09 LAB — D-DIMER, QUANTITATIVE: D-Dimer, Quant: 0.29 ug/mL-FEU (ref 0.00–0.50)

## 2019-05-09 LAB — URINE CULTURE: Culture: NO GROWTH

## 2019-05-09 LAB — LACTIC ACID, PLASMA: Lactic Acid, Venous: 0.7 mmol/L (ref 0.5–1.9)

## 2019-05-09 LAB — FERRITIN: Ferritin: 231 ng/mL (ref 24–336)

## 2019-05-09 LAB — ABO/RH: ABO/RH(D): A POS

## 2019-05-09 MED ORDER — LORAZEPAM 0.5 MG PO TABS
0.5000 mg | ORAL_TABLET | Freq: Three times a day (TID) | ORAL | Status: DC | PRN
  Administered 2019-05-09 – 2019-05-11 (×3): 0.5 mg via ORAL
  Filled 2019-05-09 (×3): qty 1

## 2019-05-09 NOTE — ED Notes (Signed)
Pt provided breakfast tray.

## 2019-05-09 NOTE — ED Notes (Signed)
Pt prefers to take meds with applesauce. None available in dept. Pt did well with all meds, but refused zinc and vit c at this time.

## 2019-05-09 NOTE — Progress Notes (Signed)
Nathan NOTE  JES Vazquez TIW:[id number] DOB: [birth year]   PCP: Laqueta Due., MD  Patient is from: Home.  Lives with his wife who is also admitted for COVID-19 infection.  DOA: 05/08/2019 LOS: 1  Brief Narrative / Interim history: 86 year old male with history of HTN, sick sinus syndrome s/p PPM, A. fib on Eliquis, Parkinson's disease, HTN and hypothyroidism who was sent from urgent care with generalized weakness and hypotension.    In ED, tested positive for COVID-19.  Vitals within normal orthostatic vital positive.  No hypoxia.  CXR with bibasilar opacities.  Inflammatory markers elevated.  Procalcitonin negative.  Admitted for COVID-19 pneumonia, generalized weakness and orthostatic hypotension.  Started on remdesivir.  Subjective: No major events overnight or this morning.  Feels better.  Denies dyspnea, chest pain, cough, nausea, vomiting, diarrhea, abdominal pain or UTI symptoms.  Concern about his wife.  Objective: Vitals:   05/09/19 1000 05/09/19 1030 05/09/19 1100 05/09/19 1130  BP: 129/86 134/88 (!) 147/99 (!) 142/87  Pulse: 73 80 81 81  Resp: 15 17 15 13   Temp:      TempSrc:      SpO2: 96% 96% 97% 97%  Weight:      Height:        Intake/Output Summary (Last 24 hours) at 05/09/2019 1136 Last data filed at 05/09/2019 0225 Gross per 24 hour  Intake 250 ml  Output 1375 ml  Net -1125 ml   Filed Weights   05/08/19 1259  Weight: 69.9 kg    Examination:  GENERAL: No acute distress.  Appears well.  HEENT: MMM.  Vision and hearing grossly intact.  NECK: Supple.  No apparent JVD.  RESP:  No IWOB. Good air movement bilaterally. CVS: Irregular rhythm.  Normal rate. Heart sounds normal.  ABD/GI/GU: Bowel sounds present. Soft. Non tender.  MSK/EXT:  Moves extremities. No apparent deformity. No edema.  SKIN: no apparent skin lesion or wound NEURO: Awake, alert and oriented appropriately.  No apparent focal neuro deficit. PSYCH: Calm. Normal affect.  Procedures:   None  Assessment & Plan: Acute COVID-19 infection: CXR with bibasilar opacities.  CRP elevated to 2.3 markers within normal.  Pro-Cal negative.  No respiratory distress or oxygen requirement. Recent Labs    05/08/19 1259 05/09/19 0515  DDIMER 0.33 0.29  FERRITIN  --  231  LDH 113  --   CRP  --  2.3*  -Continue remdesivir 1/5>> -Already on Eliquis for A. fib. -Monitor inflammatory markers -Supportive care with Tylenol, vitamin C and zinc.  Orthostatic hypotension: patient presented with generalized weakness and dizziness.  Orthostatic vitals positive in ED.  Could have autonomic dysregulation due to Parkinson disease.  Covid infection could definitely contribute.  Reports improvement in his weakness and dizziness. -Hold home amlodipine and Micardis. -Recheck orthostatic vitals -Ted hose -PT/OT eval  Persistent atrial fibrillation: Rate controlled. -Continue Cardizem and Eliquis.  History of sick sinus syndrome s/p PPM  Anemia of chronic disease: H&H stable. -Continue monitoring  BPH without LUTS -Continue home Flomax.  GERD: -Continue PPI.  History of Parkinson's disease/RLS -Continue Requip and Mirapex  Debility -PT/OT eval              DVT prophylaxis: On Eliquis for A. fib. Code Status: Full code Family Communication: Patient and/or RN. Available if any question. Disposition Plan: Remains inpatient due to orthostatic hypotension Consultants: None   Microbiology summarized: COVID-19 positive.    Sch Meds:  Scheduled Meds: . albuterol  2 puff Inhalation Q6H  .  apixaban  5 mg Oral BID  . vitamin C  500 mg Oral Daily  . diltiazem  180 mg Oral Daily  . pantoprazole  40 mg Oral Daily  . rOPINIRole  0.5 mg Oral TID  . tamsulosin  0.4 mg Oral Daily  . zinc sulfate  220 mg Oral Daily   Continuous Infusions: . sodium chloride 100 mL/hr at 05/08/19 1750  . remdesivir 100 mg in NS 100 mL 100 mg (05/09/19 1106)   PRN  Meds:.guaiFENesin-dextromethorphan  Antimicrobials: Anti-infectives (From admission, onward)   Start     Dose/Rate Route Frequency Ordered Stop   05/09/19 1000  remdesivir 100 mg in sodium chloride 0.9 % 100 mL IVPB  Status:  Discontinued     100 mg 200 mL/hr over 30 Minutes Intravenous Daily 05/08/19 2156 05/08/19 2211   05/09/19 1000  remdesivir 100 mg in sodium chloride 0.9 % 100 mL IVPB     100 mg 200 mL/hr over 30 Minutes Intravenous Daily 05/08/19 1556 05/13/19 0959   05/08/19 2156  remdesivir 200 mg in sodium chloride 0.9% 250 mL IVPB  Status:  Discontinued     200 mg 580 mL/hr over 30 Minutes Intravenous Once 05/08/19 2156 05/08/19 2211   05/08/19 1630  remdesivir 200 mg in sodium chloride 0.9% 250 mL IVPB     200 mg 580 mL/hr over 30 Minutes Intravenous Once 05/08/19 1556 05/08/19 1932      I have personally reviewed the following labs and images: CBC: Recent Labs  Lab 05/08/19 1259 05/09/19 0515  WBC 4.2 3.5*  NEUTROABS 3.1 2.3  HGB 10.7* 10.7*  HCT 33.2* 32.8*  MCV 95.7 94.3  PLT 149* 159   BMP &GFR Recent Labs  Lab 05/08/19 1259 05/09/19 0515  NA 137 137  K 4.7 4.4  CL 102 102  CO2 27 24  GLUCOSE 100* 84  BUN 19 16  CREATININE 1.09 0.86  CALCIUM 8.4* 8.7*   Estimated Creatinine Clearance: 62.1 mL/min (by C-G formula based on SCr of 0.86 mg/dL). Liver & Pancreas: Recent Labs  Lab 05/08/19 1259 05/09/19 0515  AST 17 17  ALT 14 14  ALKPHOS 76 77  BILITOT 1.1 0.7  PROT 6.3* 6.2*  ALBUMIN 3.7 3.3*   No results for input(s): LIPASE, AMYLASE in the last 168 hours. No results for input(s): AMMONIA in the last 168 hours. Diabetic: No results for input(s): HGBA1C in the last 72 hours. No results for input(s): GLUCAP in the last 168 hours. Cardiac Enzymes: No results for input(s): CKTOTAL, CKMB, CKMBINDEX, TROPONINI in the last 168 hours. No results for input(s): PROBNP in the last 8760 hours. Coagulation Profile: Recent Labs  Lab 05/08/19 1259   INR 1.6*   Thyroid Function Tests: No results for input(s): TSH, T4TOTAL, FREET4, T3FREE, THYROIDAB in the last 72 hours. Lipid Profile: Recent Labs    05/08/19 1259  TRIG 82   Anemia Panel: Recent Labs    05/09/19 0515  FERRITIN 231   Urine analysis:    Component Value Date/Time   COLORURINE YELLOW 05/08/2019 1259   APPEARANCEUR CLEAR 05/08/2019 1259   LABSPEC 1.006 05/08/2019 1259   PHURINE 7.0 05/08/2019 1259   GLUCOSEU NEGATIVE 05/08/2019 1259   HGBUR SMALL (A) 05/08/2019 1259   BILIRUBINUR NEGATIVE 05/08/2019 1259   KETONESUR NEGATIVE 05/08/2019 1259   PROTEINUR NEGATIVE 05/08/2019 1259   NITRITE NEGATIVE 05/08/2019 1259   LEUKOCYTESUR NEGATIVE 05/08/2019 1259   Sepsis Labs: Invalid input(s): PROCALCITONIN, LACTICIDVEN  Microbiology: No results found  for this or any previous visit (from the past 240 hour(s)).  Radiology Studies: DG Chest Port 1 View  Result Date: 05/08/2019 CLINICAL DATA:  Fatigue and weakness for the past 3 weeks. COVID exposure. EXAM: PORTABLE CHEST 1 VIEW COMPARISON:  Chest x-ray dated October 24, 2018. FINDINGS: Unchanged left chest wall pacemaker. Stable mild cardiomegaly. Atherosclerotic calcification of the aortic arch. Normal pulmonary vascularity. Minimal bibasilar opacities. No pleural effusion or pneumothorax. No acute osseous abnormality. IMPRESSION: 1. Minimal bibasilar opacities which could reflect atelectasis or pneumonia, including atypical infection. Electronically Signed   By: Titus Dubin M.D.   On: 05/08/2019 14:35   35 minutes with more than 50% spent in reviewing records, counseling patient/family and coordinating care.  Meagen Limones T. [place]  If 7PM-7AM, please contact night-coverage [email or web address] Password [facility] 05/09/2019, 11:36 AM

## 2019-05-09 NOTE — ED Notes (Signed)
Offered pt to be repositioned in the bed, but pt states he is comfortable at this time. Pt has call bell within reach.

## 2019-05-09 NOTE — Evaluation (Signed)
Physical Therapy Evaluation Patient Details Name: Nathan Vazquez MRN: [id number] DOB: 1932-03-29 Today's Date: 05/09/2019   History of Present Illness  Pt is 86 year old male with history of HTN, sick sinus syndrome s/p PPM, A. fib on Eliquis, Parkinson's disease, HTN and hypothyroidism who was sent from urgent care with generalized weakness and hypotension.  Pt admitted with COVID 19 PNE, orthostatic hypotension.  Clinical Impression  Pt admitted with above diagnosis. Pt was able to transfer with min-mod A with PT and take a few steps.  He was limited by orthostatic hypotension and elevated HR.  O2 sats were stable on RA.  Pt alert and oriented and able to state safety concerns (doesn't get in shower b/c FOF, plans to start using RW). Pt normally independent and with good rehab potential; hopeful for ability to d/c home with family support (pt reports can get 24 hr assist). Pt currently with functional limitations due to the deficits listed below (see PT Problem List). Pt will benefit from skilled PT to increase their independence and safety with mobility to allow discharge to the venue listed below.       Follow Up Recommendations Home health PT;Supervision/Assistance - 24 hour    Equipment Recommendations  3in1 (PT)    Recommendations for Other Services       Precautions / Restrictions Precautions Precautions: Fall Restrictions Weight Bearing Restrictions: No      Mobility  Bed Mobility Overal bed mobility: Needs Assistance Bed Mobility: Supine to Sit;Sit to Supine     Supine to sit: Min assist Sit to supine: Min assist   General bed mobility comments: increased time ; HOB elevated; assist for LE and to boost trunk  Transfers Overall transfer level: Needs assistance Equipment used: Rolling walker (2 wheeled) Transfers: Sit to/from Stand Sit to Stand: From elevated surface;Min assist         General transfer comment: cues for safe hand  placement  Ambulation/Gait Ambulation/Gait assistance: Min assist Gait Distance (Feet): 2 Feet Assistive device: Rolling walker (2 wheeled) Gait Pattern/deviations: Decreased stride length;Shuffle     General Gait Details: side steps to [facility]; posterior lean; no further ambulation due to nursing called in and reports telemetry states HR elevated  Stairs            Wheelchair Mobility    Modified Rankin (Stroke Patients Only)       Balance Overall balance assessment: Needs assistance Sitting-balance support: Bilateral upper extremity supported;Feet supported Sitting balance-Leahy Scale: Fair     Standing balance support: Bilateral upper extremity supported;During functional activity Standing balance-Leahy Scale: Poor Standing balance comment: posterior lean; depending on bed behind legs for balance                             Pertinent Vitals/Pain Pain Assessment: No/denies pain   On RA with O2 sats 97-98% HR reading with PT were 70-90 bpm; however, nursing called in and reports telemetry called and HR elevated BP as follows: Orthostatic BPs  Supine 128/84  Sitting 103/80 (mild symptoms)     Standing 96/68 (mild symptoms)  Standing after 3 min 115/60 (reports feeling weak)       Home Living Family/patient expects to be discharged to:: Private residence Living Arrangements: Spouse/significant other Available Help at Discharge: Family Type of Home: House Home Access: Stairs to enter Entrance Stairs-Rails: Right Entrance Stairs-Number of Steps: 1 Home Layout: One level Home Equipment: Walker - 2 wheels(3 wheel walker)  Prior Function Level of Independence: Independent         Comments: Reports independent with ADLs and some IADLs; does report only does sponge baths because afraid to get in tub/shower; can ambulate in community     Hand Dominance        Extremity/Trunk Assessment   Upper Extremity Assessment Upper Extremity  Assessment: (Bil UE 4+/5 strength; ROM WFL)    Lower Extremity Assessment Lower Extremity Assessment: (Bil LE ROM WFL; MMT 4+/5; decreased coordination with toe tapping)    Cervical / Trunk Assessment Cervical / Trunk Assessment: Normal  Communication      Cognition Arousal/Alertness: Awake/alert Behavior During Therapy: WFL for tasks assessed/performed Overall Cognitive Status: Within Functional Limits for tasks assessed                                 General Comments: aware of current events      General Comments      Exercises     Assessment/Plan    PT Assessment Patient needs continued PT services  PT Problem List Decreased strength;Decreased mobility;Decreased activity tolerance;Cardiopulmonary status limiting activity;Decreased balance;Decreased knowledge of use of DME       PT Treatment Interventions DME instruction;Therapeutic activities;Gait training;Therapeutic exercise;Patient/family education;Stair training;Balance training;Functional mobility training    PT Goals (Current goals can be found in the Care Plan section)  Acute Rehab PT Goals Patient Stated Goal: return home if able PT Goal Formulation: With patient Time For Goal Achievement: 05/23/19 Potential to Achieve Goals: Good    Frequency Min 3X/week   Barriers to discharge        Co-evaluation               AM-PAC PT "6 Clicks" Mobility  Outcome Measure Help needed turning from your back to your side while in a flat bed without using bedrails?: A Little Help needed moving from lying on your back to sitting on the side of a flat bed without using bedrails?: A Lot Help needed moving to and from a bed to a chair (including a wheelchair)?: A Lot Help needed standing up from a chair using your arms (e.g., wheelchair or bedside chair)?: A Little Help needed to walk in hospital room?: A Lot Help needed climbing 3-5 steps with a railing? : A Lot 6 Click Score: 14    End of  Session Equipment Utilized During Treatment: Gait belt Activity Tolerance: Patient tolerated treatment well Patient left: in bed;with bed alarm set;with call bell/phone within reach Nurse Communication: Mobility status PT Visit Diagnosis: Unsteadiness on feet (R26.81);Other abnormalities of gait and mobility (R26.89);Muscle weakness (generalized) (M62.81)    Time: 9702-6378 PT Time Calculation (min) (ACUTE ONLY): 28 min   Charges:   PT Evaluation $PT Eval Moderate Complexity: 1 Mod          Maggie Font, PT Acute Rehab Services Pager [phone] [facility] [phone] [facility] [phone]   Karlton Lemon 05/09/2019, 4:41 PM

## 2019-05-09 NOTE — ED Notes (Signed)
Pt provided popsicle

## 2019-05-09 NOTE — Plan of Care (Signed)
Updated patient's son over the phone. 

## 2019-05-10 ENCOUNTER — Other Ambulatory Visit: Payer: Self-pay

## 2019-05-10 DIAGNOSIS — R531 Weakness: Secondary | ICD-10-CM

## 2019-05-10 LAB — CBC WITH DIFFERENTIAL/PLATELET
Abs Immature Granulocytes: 0.01 10*3/uL (ref 0.00–0.07)
Basophils Absolute: 0 10*3/uL (ref 0.0–0.1)
Basophils Relative: 0 %
Eosinophils Absolute: 0 10*3/uL (ref 0.0–0.5)
Eosinophils Relative: 1 %
HCT: 33.2 % — ABNORMAL LOW (ref 39.0–52.0)
Hemoglobin: 10.7 g/dL — ABNORMAL LOW (ref 13.0–17.0)
Immature Granulocytes: 0 %
Lymphocytes Relative: 16 %
Lymphs Abs: 0.7 10*3/uL (ref 0.7–4.0)
MCH: 30.7 pg (ref 26.0–34.0)
MCHC: 32.2 g/dL (ref 30.0–36.0)
MCV: 95.1 fL (ref 80.0–100.0)
Monocytes Absolute: 0.5 10*3/uL (ref 0.1–1.0)
Monocytes Relative: 11 %
Neutro Abs: 3 10*3/uL (ref 1.7–7.7)
Neutrophils Relative %: 72 %
Platelets: 169 10*3/uL (ref 150–400)
RBC: 3.49 MIL/uL — ABNORMAL LOW (ref 4.22–5.81)
RDW: 12.5 % (ref 11.5–15.5)
WBC: 4.2 10*3/uL (ref 4.0–10.5)
nRBC: 0 % (ref 0.0–0.2)

## 2019-05-10 LAB — COMPREHENSIVE METABOLIC PANEL
ALT: 13 U/L (ref 0–44)
AST: 16 U/L (ref 15–41)
Albumin: 3.1 g/dL — ABNORMAL LOW (ref 3.5–5.0)
Alkaline Phosphatase: 67 U/L (ref 38–126)
Anion gap: 10 (ref 5–15)
BUN: 16 mg/dL (ref 8–23)
CO2: 25 mmol/L (ref 22–32)
Calcium: 8.4 mg/dL — ABNORMAL LOW (ref 8.9–10.3)
Chloride: 98 mmol/L (ref 98–111)
Creatinine, Ser: 0.76 mg/dL (ref 0.61–1.24)
GFR calc Af Amer: >60 mL/min (ref >60)
GFR calc non Af Amer: >60 mL/min (ref >60)
Glucose, Bld: 92 mg/dL (ref 70–99)
Potassium: 4 mmol/L (ref 3.5–5.1)
Sodium: 133 mmol/L — ABNORMAL LOW (ref 135–145)
Total Bilirubin: 0.8 mg/dL (ref 0.3–1.2)
Total Protein: 5.9 g/dL — ABNORMAL LOW (ref 6.5–8.1)

## 2019-05-10 LAB — C-REACTIVE PROTEIN: CRP: 1.8 mg/dL — ABNORMAL HIGH (ref <1.0)

## 2019-05-10 LAB — FERRITIN: Ferritin: 246 ng/mL (ref 24–336)

## 2019-05-10 LAB — D-DIMER, QUANTITATIVE: D-Dimer, Quant: 0.41 ug/mL-FEU (ref 0.00–0.50)

## 2019-05-10 MED ORDER — PREDNISONE 10 MG PO TABS: 30.0000 mg | ORAL_TABLET | Freq: Every day | ORAL | 0 refills | Status: DC

## 2019-05-10 MED ORDER — TELMISARTAN 40 MG PO TABS: 40.0000 mg | ORAL_TABLET | Freq: Every day | ORAL | 1 refills | Status: DC

## 2019-05-10 MED ORDER — MAGNESIUM HYDROXIDE 400 MG/5ML PO SUSP: 10.0000 mL | Freq: Every day | ORAL | Status: DC | PRN

## 2019-05-10 NOTE — Progress Notes (Signed)
Physical Therapy Treatment Patient Details Name: Nathan Vazquez MRN: [id number] DOB: 19-May-1931 Today's Date: 05/10/2019    History of Present Illness Pt is 86 year old male with history of HTN, sick sinus syndrome s/p PPM, A. fib on Eliquis, Parkinson's disease, HTN and hypothyroidism who was sent from urgent care with generalized weakness and hypotension.  Pt admitted with COVID 19 PNE, orthostatic hypotension.    PT Comments    Patient progressing well with therapy and increased ambulation distance in hospital room. Pt on RA thorughout session and SpO2 remained 98-100% throughout session. Pt required min assist for sit<>stand transfers from recliner and toilet with cues for safe hand placement. Initially on standing and during gait pt with slight posterior lean and required min assist on 3 bouts to prevent LOB. As pt continues to require min assist to prevent falls while mobilizing and does not have 24/7 assist at home he will benefit from additional skilled PT interventions in SNF setting to improve independence with functional mobility. Acute PT will continue to progress pt as able.    Follow Up Recommendations  Supervision/Assistance - 24 hour;SNF     Equipment Recommendations  3in1 (PT)    Recommendations for Other Services       Precautions / Restrictions Precautions Precautions: Fall Restrictions Weight Bearing Restrictions: No    Mobility  Bed Mobility Overal bed mobility: Needs Assistance Bed Mobility: Sit to Supine     Supine to sit: Min assist Sit to supine: Min assist   General bed mobility comments: cues to sequence lowering trunk and raising LE's onto bed, assist for lifting LE's onto bed. verbal/tactile cues for use of bed rails and brdiging to scoot hips up and reposition in bed.  Transfers Overall transfer level: Needs assistance Equipment used: Rolling walker (2 wheeled) Transfers: Sit to/from Stand Sit to Stand: Min assist Stand pivot transfers: Min  assist       General transfer comment: verbal/tactile cues for safe hand placement and technique with RW, assist to initiate power up from bedside recliner and toilet. cues for safe rech back to grab bars on toilet and to EOB. assist to rise on each stand and cues to facilitate upright posture, assist to steady.  Ambulation/Gait Ambulation/Gait assistance: Min assist Gait Distance (Feet): 35 Feet(multile loops in hospital room and into bathroom) Assistive device: Rolling walker (2 wheeled) Gait Pattern/deviations: Decreased stride length;Shuffle;Narrow base of support;Trunk flexed Gait velocity: decreased   General Gait Details: min assist to steady with gait for walking in hospital room, pt with 2 episodes of LOB requiring assist to regain. pt ambulated into bathroom as well.   Stairs             Wheelchair Mobility    Modified Rankin (Stroke Patients Only)       Balance Overall balance assessment: Needs assistance Sitting-balance support: Bilateral upper extremity supported;Feet supported Sitting balance-Leahy Scale: Fair Sitting balance - Comments: pt required assist for pericare in bathroom Postural control: Posterior lean Standing balance support: Bilateral upper extremity supported;During functional activity Standing balance-Leahy Scale: Poor Standing balance comment: posterior lean           Cognition Arousal/Alertness: Awake/alert Behavior During Therapy: WFL for tasks assessed/performed Overall Cognitive Status: Within Functional Limits for tasks assessed          General Comments: aware of current events; follows multistep commands      Exercises      General Comments        Pertinent Vitals/Pain Pain Assessment: No/denies  pain    Home Living Family/patient expects to be discharged to:: Private residence Living Arrangements: Spouse/significant other Available Help at Discharge: Family Type of Home: House Home Access: Stairs to  enter Entrance Stairs-Rails: Right Home Layout: One level Home Equipment: Environmental consultant - 2 wheels(3 wheel walker)      Prior Function Level of Independence: Independent      Comments: Reports independent with ADLs and some IADLs; does report only does sponge baths because afraid to get in tub/shower; can ambulate in community   PT Goals (current goals can now be found in the care plan section) Acute Rehab PT Goals Patient Stated Goal: return home if able PT Goal Formulation: With patient Time For Goal Achievement: 05/23/19 Potential to Achieve Goals: Good Progress towards PT goals: Progressing toward goals    Frequency    Min 3X/week      PT Plan Current plan remains appropriate       AM-PAC PT "6 Clicks" Mobility   Outcome Measure  Help needed turning from your back to your side while in a flat bed without using bedrails?: A Little Help needed moving from lying on your back to sitting on the side of a flat bed without using bedrails?: A Lot Help needed moving to and from a bed to a chair (including a wheelchair)?: A Lot Help needed standing up from a chair using your arms (e.g., wheelchair or bedside chair)?: A Little Help needed to walk in hospital room?: A Lot Help needed climbing 3-5 steps with a railing? : A Lot 6 Click Score: 14    End of Session Equipment Utilized During Treatment: Gait belt Activity Tolerance: Patient tolerated treatment well Patient left: in bed;with bed alarm set;with call bell/phone within reach Nurse Communication: Mobility status PT Visit Diagnosis: Unsteadiness on feet (R26.81);Other abnormalities of gait and mobility (R26.89);Muscle weakness (generalized) (M62.81)     Time: 9528-4132 PT Time Calculation (min) (ACUTE ONLY): 33 min  Charges:  $Gait Training: 8-22 mins $Therapeutic Activity: 8-22 mins                     Verner Mould, DPT Physical Therapist with [facility] [phone]  05/10/2019 5:30  PM

## 2019-05-10 NOTE — Plan of Care (Signed)

## 2019-05-10 NOTE — TOC Progression Note (Signed)
Transition of Care [facility]) - Progression Note    Patient Details  Name: Nathan Vazquez MRN: [id number] Date of Birth: 15-Dec-1931  [facility] CM/SW Contact  Geni Bers, RN Phone Number: 05/10/2019, 4:10 PM  Clinical Narrative:    Spoke with pt concerning HH. Pt referred CM to Son and daughter. Spoke with both adult Childrens. Daughter agreed with pt going to SNF.         Expected Discharge Plan and Services           Expected Discharge Date: 05/10/19                                     Social Determinants of Health (SDOH) Interventions    Readmission Risk Interventions No flowsheet data found.

## 2019-05-10 NOTE — NC FL2 (Signed)
[place] LEVEL OF CARE SCREENING TOOL     IDENTIFICATION  Patient Name: Nathan Vazquez Birthdate: 11/17/1931 Sex: male Admission Date (Current Location): 05/08/2019  [facility] and [place] Number:  Herbalist and Address:  [facility],  [place] [address] [place]      Provider Number: [id number]  Attending Physician Name and Address:  Mercy Riding, MD  Relative Name and Phone Number:  Temple Sporer [phone]    Current Level of Care: Hospital Recommended Level of Care: [place] Prior Approval Number:    Date Approved/Denied:   PASRR Number:    Discharge Plan: SNF    Current Diagnoses: Patient Active Problem List   Diagnosis Date Noted  . Acute respiratory disease due to COVID-19 virus 05/08/2019  . Hypotension 05/08/2019  . Atrial fibrillation, chronic ([place]) 05/08/2019  . Parkinson's disease ([place]) 05/08/2019  . Debility 05/08/2019  . H/O sick sinus syndrome 05/08/2019  . BPH (benign prostatic hyperplasia) 05/08/2019  . GERD (gastroesophageal reflux disease) 05/08/2019    Orientation RESPIRATION BLADDER Height & Weight     Self, Time  Normal Continent, External catheter Weight: 73.2 kg Height:  5\' 9"  (175.3 cm)  BEHAVIORAL SYMPTOMS/MOOD NEUROLOGICAL BOWEL NUTRITION STATUS      Continent Diet(Heart Healthy)  AMBULATORY STATUS COMMUNICATION OF NEEDS Skin   Extensive Assist Verbally Normal                       Personal Care Assistance Level of Assistance  Bathing, Feeding, Dressing Bathing Assistance: Limited assistance Feeding assistance: Independent Dressing Assistance: Limited assistance     Functional Limitations Info  Sight, Hearing, Speech Sight Info: Adequate Hearing Info: Adequate Speech Info: Impaired    SPECIAL CARE FACTORS FREQUENCY  PT (By licensed PT), OT (By licensed OT)     PT Frequency: Eval and Treat OT Frequency: Eval and Treat            Contractures       Additional Factors Info  Code Status, Allergies Code Status Info: FULL Allergies Info: Penicillin G           Current Medications (05/10/2019):  This is the current hospital active medication list Current Facility-Administered Medications  Medication Dose Route Frequency Provider Last Rate Last Admin  . 0.9 %  sodium chloride infusion   Intravenous Continuous Rai, Ripudeep K, MD 100 mL/hr at 05/08/19 1750 New Bag at 05/08/19 1750  . albuterol (VENTOLIN HFA) 108 (90 Base) MCG/ACT inhaler 2 puff  2 puff Inhalation Q6H Rai, Ripudeep K, MD   2 puff at 05/10/19 0828  . apixaban (ELIQUIS) tablet 5 mg  5 mg Oral BID Rai, Ripudeep K, MD   5 mg at 05/10/19 0828  . ascorbic acid (VITAMIN C) tablet 500 mg  500 mg Oral Daily Rai, Ripudeep K, MD   500 mg at 05/10/19 0827  . diltiazem (CARDIZEM CD) 24 hr capsule 180 mg  180 mg Oral Daily Rai, Ripudeep K, MD   180 mg at 05/10/19 0827  . guaiFENesin-dextromethorphan (ROBITUSSIN DM) 100-10 MG/5ML syrup 10 mL  10 mL Oral Q4H PRN Rai, Ripudeep K, MD      . LORazepam (ATIVAN) tablet 0.5 mg  0.5 mg Oral Q8H PRN Wendee Beavers T, MD   0.5 mg at 05/09/19 1733  . pantoprazole (PROTONIX) EC tablet 40 mg  40 mg Oral Daily Rai, Ripudeep K, MD   40 mg at 05/10/19 0827  .  remdesivir 100 mg in sodium chloride 0.9 % 100 mL IVPB  100 mg Intravenous Daily Poindexter, Leann T, RPH 200 mL/hr at 05/10/19 1044 100 mg at 05/10/19 1044  . rOPINIRole (REQUIP) tablet 0.5 mg  0.5 mg Oral TID Rai, Ripudeep K, MD   0.5 mg at 05/10/19 0827  . tamsulosin (FLOMAX) capsule 0.4 mg  0.4 mg Oral Daily Rai, Ripudeep K, MD   0.4 mg at 05/10/19 0827  . zinc sulfate capsule 220 mg  220 mg Oral Daily Rai, Ripudeep K, MD   220 mg at 05/10/19 0827     Discharge Medications: Please see discharge summary for a list of discharge medications.  Relevant Imaging Results:  Relevant Lab Results:   Additional Information FV#[id number]  Geni Bers, RN

## 2019-05-10 NOTE — Progress Notes (Signed)
Called dietary and added scrambled eggs to patient's breakfast tray per patient request.

## 2019-05-10 NOTE — Progress Notes (Signed)
PROGRESS NOTE  Nathan Vazquez QMG:[id number] DOB: 09/26/1931   PCP: Karleen Hampshire., MD  Patient is from: Home.  Lives with his wife who is also admitted for COVID-19 infection.  DOA: 05/08/2019 LOS: 2  Brief Narrative / Interim history: 86 year old male with history of HTN, sick sinus syndrome s/p PPM, A. fib on Eliquis, Parkinson's disease, HTN and hypothyroidism who was sent from urgent care with generalized weakness and hypotension.    In ED, tested positive for COVID-19.  Vitals within normal orthostatic vital positive.  No hypoxia.  CXR with bibasilar opacities.  Inflammatory markers elevated.  Procalcitonin negative.  Admitted for COVID-19 pneumonia, generalized weakness and orthostatic hypotension.  Started on remdesivir.   Subjective: No major events overnight of this morning.  He says he did not have a good sleep due to noise from the vent system.  Otherwise, no complaints.  Denies chest pain, dyspnea, GI or UTI symptoms.  Concerned about his wife.  Objective: Vitals:   05/09/19 1330 05/09/19 1408 05/09/19 2132 05/10/19 0430  BP: (!) 144/100 (!) 148/92 105/80 139/89  Pulse: 87 82 97 64  Resp: 19 16 18 18   Temp:  (!) 97.5 F (36.4 C) 98 F (36.7 C) 97.8 F (36.6 C)  TempSrc:  Oral Oral Oral  SpO2: 96% 100% 98% 96%  Weight:  73.2 kg    Height:  5\' 9"  (1.753 m)      Intake/Output Summary (Last 24 hours) at 05/10/2019 1156 Last data filed at 05/10/2019 0437 Gross per 24 hour  Intake 329.24 ml  Output 1750 ml  Net -1420.76 ml   Filed Weights   05/08/19 1259 05/09/19 1408  Weight: 69.9 kg 73.2 kg    Examination:  GENERAL: No acute distress.  Appears well.  HEENT: MMM.  Vision and hearing grossly intact.  NECK: Supple.  No apparent JVD.  RESP:  No IWOB.  Fair aeration bilaterally. CVS: Irregular rhythm.  Normal rate.  Heart sounds normal.  ABD/GI/GU: Bowel sounds present. Soft. Non tender.  MSK/EXT:  Moves extremities. No apparent deformity. No edema.  SKIN: no  apparent skin lesion or wound NEURO: Awake, alert and oriented x4.  No apparent focal neuro deficit. PSYCH: Calm. Normal affect.  Procedures:  None  Assessment & Plan: Acute COVID-19 infection: CXR with bibasilar opacities.  CRP elevated to 2.3 markers within normal.  Pro-Cal negative.  No respiratory distress or oxygen requirement. Recent Labs    05/08/19 1259 05/09/19 0515 05/10/19 0336  DDIMER 0.33 0.29 0.41  FERRITIN  --  231 246  LDH 113  --   --   CRP  --  2.3* 1.8*  -Continue remdesivir 1/5>> -Already on Eliquis for A. fib. -Monitor inflammatory markers -Supportive care with Tylenol, vitamin C and zinc.  Orthostatic hypotension: patient presented with generalized weakness and dizziness.  Orthostatic vitals positive in ED.  Could have autonomic dysregulation due to Parkinson disease.  Covid infection could definitely contribute.  -Hold home amlodipine and Micardis. -Recheck orthostatic vitals -Ted hose  Persistent atrial fibrillation: Rate controlled. -Continue Cardizem and Eliquis.  History of sick sinus syndrome s/p PPM  Anemia of chronic disease: H&H stable. -Continue monitoring  BPH without LUTS -Continue home Flomax.  GERD: -Continue PPI.  History of Parkinson's disease/RLS -Continue Requip and Mirapex  Debility/physical deconditioning/generalized weakness -PT/OT recommended 24/7 supervision but family not able to provide this as some of them are sick with COVID themselves.  DVT prophylaxis: On Eliquis for A. fib. Code Status: Full code Family Communication: Updated patient's son and daughter over the phone.  Disposition Plan: Remains inpatient due to debility/physical deconditioning pending safe disposition which is likely SNF.  Family not able to provide 24/7 supervision. Consultants: None   Microbiology summarized: COVID-19 positive.    Sch Meds:  Scheduled Meds: . albuterol  2 puff Inhalation Q6H  . apixaban  5 mg Oral  BID  . vitamin C  500 mg Oral Daily  . diltiazem  180 mg Oral Daily  . pantoprazole  40 mg Oral Daily  . rOPINIRole  0.5 mg Oral TID  . tamsulosin  0.4 mg Oral Daily  . zinc sulfate  220 mg Oral Daily   Continuous Infusions: . sodium chloride 100 mL/hr at 05/08/19 1750  . remdesivir 100 mg in NS 100 mL 100 mg (05/10/19 1044)   PRN Meds:.guaiFENesin-dextromethorphan, LORazepam  Antimicrobials: Anti-infectives (From admission, onward)   Start     Dose/Rate Route Frequency Ordered Stop   05/09/19 1000  remdesivir 100 mg in sodium chloride 0.9 % 100 mL IVPB  Status:  Discontinued     100 mg 200 mL/hr over 30 Minutes Intravenous Daily 05/08/19 2156 05/08/19 2211   05/09/19 1000  remdesivir 100 mg in sodium chloride 0.9 % 100 mL IVPB     100 mg 200 mL/hr over 30 Minutes Intravenous Daily 05/08/19 1556 05/13/19 0959   05/08/19 2156  remdesivir 200 mg in sodium chloride 0.9% 250 mL IVPB  Status:  Discontinued     200 mg 580 mL/hr over 30 Minutes Intravenous Once 05/08/19 2156 05/08/19 2211   05/08/19 1630  remdesivir 200 mg in sodium chloride 0.9% 250 mL IVPB     200 mg 580 mL/hr over 30 Minutes Intravenous Once 05/08/19 1556 05/08/19 1932      I have personally reviewed the following labs and images: CBC: Recent Labs  Lab 05/08/19 1259 05/09/19 0515 05/10/19 0336  WBC 4.2 3.5* 4.2  NEUTROABS 3.1 2.3 3.0  HGB 10.7* 10.7* 10.7*  HCT 33.2* 32.8* 33.2*  MCV 95.7 94.3 95.1  PLT 149* 159 169   BMP &GFR Recent Labs  Lab 05/08/19 1259 05/09/19 0515 05/10/19 0336  NA 137 137 133*  K 4.7 4.4 4.0  CL 102 102 98  CO2 27 24 25   GLUCOSE 100* 84 92  BUN 19 16 16   CREATININE 1.09 0.86 0.76  CALCIUM 8.4* 8.7* 8.4*   Estimated Creatinine Clearance: 67.5 mL/min (by C-G formula based on SCr of 0.76 mg/dL). Liver & Pancreas: Recent Labs  Lab 05/08/19 1259 05/09/19 0515 05/10/19 0336  AST 17 17 16   ALT 14 14 13   ALKPHOS 76 77 67  BILITOT 1.1 0.7 0.8  PROT 6.3* 6.2* 5.9*   ALBUMIN 3.7 3.3* 3.1*   No results for input(s): LIPASE, AMYLASE in the last 168 hours. No results for input(s): AMMONIA in the last 168 hours. Diabetic: No results for input(s): HGBA1C in the last 72 hours. No results for input(s): GLUCAP in the last 168 hours. Cardiac Enzymes: No results for input(s): CKTOTAL, CKMB, CKMBINDEX, TROPONINI in the last 168 hours. No results for input(s): PROBNP in the last 8760 hours. Coagulation Profile: Recent Labs  Lab 05/08/19 1259  INR 1.6*   Thyroid Function Tests: No results for input(s): TSH, T4TOTAL, FREET4, T3FREE, THYROIDAB in the last 72 hours. Lipid Profile: Recent Labs    05/08/19 1259  TRIG 82   Anemia Panel: Recent Labs  05/09/19 0515 05/10/19 0336  FERRITIN 231 246   Urine analysis:    Component Value Date/Time   COLORURINE YELLOW 05/08/2019 1259   APPEARANCEUR CLEAR 05/08/2019 1259   LABSPEC 1.006 05/08/2019 1259   PHURINE 7.0 05/08/2019 1259   GLUCOSEU NEGATIVE 05/08/2019 1259   HGBUR SMALL (A) 05/08/2019 1259   BILIRUBINUR NEGATIVE 05/08/2019 1259   KETONESUR NEGATIVE 05/08/2019 1259   PROTEINUR NEGATIVE 05/08/2019 1259   NITRITE NEGATIVE 05/08/2019 1259   LEUKOCYTESUR NEGATIVE 05/08/2019 1259   Sepsis Labs: Invalid input(s): PROCALCITONIN, LACTICIDVEN  Microbiology: Recent Results (from the past 240 hour(s))  Urine culture     Status: None   Collection Time: 05/08/19 12:59 PM   Specimen: Urine, Random  Result Value Ref Range Status   Specimen Description   Final    URINE, RANDOM Performed at [facility], [address], [place], [place] [address]    Special Requests   Final    NONE Performed at [facility], [address], [place], [place] [address]    Culture   Final    NO GROWTH Performed at [facility], [address] [place], [place] [address]    Report Status 05/09/2019 FINAL  Final  Blood Culture (routine x 2)     Status: None (Preliminary  result)   Collection Time: 05/08/19  2:51 PM   Specimen: BLOOD  Result Value Ref Range Status   Specimen Description   Final    BLOOD LEFT ANTECUBITAL Performed at [facility], [address] [place], [place] [address]    Special Requests   Final    BOTTLES DRAWN AEROBIC AND ANAEROBIC Blood Culture adequate volume Performed at [facility], [address], [place], [place] [address]    Culture   Final    NO GROWTH < 24 HOURS Performed at [facility], [address], [place], [place] [address]    Report Status PENDING  Incomplete    Radiology Studies: No results found.   T.  [facility]  If 7PM-7AM, please contact night-coverage [email or web address] Password [facility] 05/10/2019, 11:56 AM

## 2019-05-10 NOTE — Evaluation (Signed)
Occupational Therapy Evaluation Patient Details Name: Nathan Vazquez MRN: [id number] DOB: 1932/02/01 Today's Date: 05/10/2019    History of Present Illness Pt is 86 year old male with history of HTN, sick sinus syndrome s/p PPM, A. fib on Eliquis, Parkinson's disease, HTN and hypothyroidism who was sent from urgent care with generalized weakness and hypotension.  Pt admitted with COVID 19 PNE, orthostatic hypotension.   Clinical Impression   Pt admitted with COVID. Pt currently with functional limitations due to the deficits listed below (see OT Problem List).  Pt will benefit from skilled OT to increase their safety and independence with ADL and functional mobility for ADL to facilitate discharge to venue listed below.   Wife also in hospital with [place].  Pt will need min A with ADL activity upon DC- will need SNF if family not able to provide A     Follow Up Recommendations  Home health OT;SNF;Supervision/Assistance - 24 hour(depending on families ability to A- will likely need SNF but pt did not seem agreeable)    Equipment Recommendations  3 in 1 bedside commode    Recommendations for Other Services       Precautions / Restrictions Precautions Precautions: Fall      Mobility Bed Mobility Overal bed mobility: Needs Assistance Bed Mobility: Supine to Sit     Supine to sit: Min assist        Transfers Overall transfer level: Needs assistance Equipment used: Rolling walker (2 wheeled) Transfers: Sit to/from [facility] Sit to Stand: From elevated surface;Min assist Stand pivot transfers: Min assist       General transfer comment: cues for safe hand placement    Balance Overall balance assessment: Needs assistance Sitting-balance support: Bilateral upper extremity supported;Feet supported Sitting balance-Leahy Scale: Fair     Standing balance support: Bilateral upper extremity supported;During functional activity Standing balance-Leahy Scale:  Poor Standing balance comment: posterior lean; depending on bed behind legs for balance                           ADL either performed or assessed with clinical judgement   ADL Overall ADL's : Needs assistance/impaired Eating/Feeding: Set up;Sitting   Grooming: Standing;Minimal assistance;Wash/dry face;Wash/dry hands   Upper Body Bathing: Set up;Sitting   Lower Body Bathing: Moderate assistance;Sit to/from stand;Cueing for safety   Upper Body Dressing : Set up;Sitting   Lower Body Dressing: Moderate assistance;Sit to/from stand;Cueing for compensatory techniques;Cueing for safety   Toilet Transfer: Minimal assistance;Stand-pivot;Cueing for safety;BSC;RW   Toileting- Clothing Manipulation and Hygiene: Minimal assistance;Sit to/from stand         General ADL Comments: will need min A with ADL activity at home                  Pertinent Vitals/Pain Pain Assessment: No/denies pain     Hand Dominance     Extremity/Trunk Assessment Upper Extremity Assessment Upper Extremity Assessment: Generalized weakness   Lower Extremity Assessment Lower Extremity Assessment: Generalized weakness   Cervical / Trunk Assessment Cervical / Trunk Assessment: Normal   Communication     Cognition Arousal/Alertness: Awake/alert Behavior During Therapy: WFL for tasks assessed/performed Overall Cognitive Status: Within Functional Limits for tasks assessed                                 General Comments: aware of current events   General Comments  Exercises     Shoulder Instructions      Home Living Family/patient expects to be discharged to:: Private residence Living Arrangements: Spouse/significant other Available Help at Discharge: Family Type of Home: House Home Access: Stairs to enter Secretary/administrator of Steps: 1 Entrance Stairs-Rails: Right Home Layout: One level     Bathroom Shower/Tub: Chief Strategy Officer:  Standard     Home Equipment: Environmental consultant - 2 wheels(3 wheel walker)          Prior Functioning/Environment Level of Independence: Independent        Comments: Reports independent with ADLs and some IADLs; does report only does sponge baths because afraid to get in tub/shower; can ambulate in community        OT Problem List: Decreased strength;Decreased activity tolerance;Impaired balance (sitting and/or standing);Decreased safety awareness;Decreased knowledge of use of DME or AE      OT Treatment/Interventions: Self-care/ADL training;Patient/family education;Therapeutic exercise;DME and/or AE instruction    OT Goals(Current goals can be found in the care plan section) Acute Rehab OT Goals Patient Stated Goal: return home if able OT Goal Formulation: With patient Time For Goal Achievement: 05/17/19  OT Frequency: Min 2X/week   Barriers to D/C:               AM-PAC OT "6 Clicks" Daily Activity     Outcome Measure Help from another person eating meals?: None Help from another person taking care of personal grooming?: A Little Help from another person toileting, which includes using toliet, bedpan, or urinal?: A Little Help from another person bathing (including washing, rinsing, drying)?: A Little Help from another person to put on and taking off regular upper body clothing?: A Little Help from another person to put on and taking off regular lower body clothing?: A Lot 6 Click Score: 18   End of Session Equipment Utilized During Treatment: Rolling walker;Gait belt Nurse Communication: Mobility status  Activity Tolerance: Patient tolerated treatment well Patient left: in chair;with call bell/phone within reach  OT Visit Diagnosis: Other abnormalities of gait and mobility (R26.89);Unsteadiness on feet (R26.81);Muscle weakness (generalized) (M62.81)                Time: 0212-0235 OT Time Calculation (min): 23 min Charges:  OT General Charges $OT Visit: 1 Visit OT  Evaluation $OT Eval Moderate Complexity: 1 Mod OT Treatments $Self Care/Home Management : 8-22 mins  Lise Auer, OT Acute Rehabilitation Services [phone] Office- [phone]     Bashir Marchetti, Karin Golden D 05/10/2019, 4:00 PM

## 2019-05-11 ENCOUNTER — Other Ambulatory Visit: Payer: Self-pay

## 2019-05-11 LAB — COMPREHENSIVE METABOLIC PANEL
ALT: 12 U/L (ref 0–44)
AST: 20 U/L (ref 15–41)
Albumin: 3.2 g/dL — ABNORMAL LOW (ref 3.5–5.0)
Alkaline Phosphatase: 70 U/L (ref 38–126)
Anion gap: 9 (ref 5–15)
BUN: 20 mg/dL (ref 8–23)
CO2: 24 mmol/L (ref 22–32)
Calcium: 8.4 mg/dL — ABNORMAL LOW (ref 8.9–10.3)
Chloride: 100 mmol/L (ref 98–111)
Creatinine, Ser: 0.83 mg/dL (ref 0.61–1.24)
GFR calc Af Amer: >60 mL/min (ref >60)
GFR calc non Af Amer: >60 mL/min (ref >60)
Glucose, Bld: 95 mg/dL (ref 70–99)
Potassium: 4.1 mmol/L (ref 3.5–5.1)
Sodium: 133 mmol/L — ABNORMAL LOW (ref 135–145)
Total Bilirubin: 0.7 mg/dL (ref 0.3–1.2)
Total Protein: 6.1 g/dL — ABNORMAL LOW (ref 6.5–8.1)

## 2019-05-11 LAB — CBC WITH DIFFERENTIAL/PLATELET
Abs Immature Granulocytes: 0.01 10*3/uL (ref 0.00–0.07)
Basophils Absolute: 0 10*3/uL (ref 0.0–0.1)
Basophils Relative: 0 %
Eosinophils Absolute: 0.1 10*3/uL (ref 0.0–0.5)
Eosinophils Relative: 1 %
HCT: 32.1 % — ABNORMAL LOW (ref 39.0–52.0)
Hemoglobin: 10.5 g/dL — ABNORMAL LOW (ref 13.0–17.0)
Immature Granulocytes: 0 %
Lymphocytes Relative: 15 %
Lymphs Abs: 0.6 10*3/uL — ABNORMAL LOW (ref 0.7–4.0)
MCH: 30.7 pg (ref 26.0–34.0)
MCHC: 32.7 g/dL (ref 30.0–36.0)
MCV: 93.9 fL (ref 80.0–100.0)
Monocytes Absolute: 0.4 10*3/uL (ref 0.1–1.0)
Monocytes Relative: 10 %
Neutro Abs: 3.2 10*3/uL (ref 1.7–7.7)
Neutrophils Relative %: 74 %
Platelets: 172 10*3/uL (ref 150–400)
RBC: 3.42 MIL/uL — ABNORMAL LOW (ref 4.22–5.81)
RDW: 12.5 % (ref 11.5–15.5)
WBC: 4.3 10*3/uL (ref 4.0–10.5)
nRBC: 0 % (ref 0.0–0.2)

## 2019-05-11 LAB — D-DIMER, QUANTITATIVE: D-Dimer, Quant: 0.41 ug/mL-FEU (ref 0.00–0.50)

## 2019-05-11 LAB — FERRITIN: Ferritin: 240 ng/mL (ref 24–336)

## 2019-05-11 LAB — C-REACTIVE PROTEIN: CRP: 1.3 mg/dL — ABNORMAL HIGH (ref <1.0)

## 2019-05-11 MED ORDER — SODIUM CHLORIDE 0.9 % IV SOLN
INTRAVENOUS | Status: DC | PRN
  Administered 2019-05-11 – 2019-05-12 (×2): 1000 mL via INTRAVENOUS

## 2019-05-11 NOTE — Progress Notes (Signed)
PROGRESS NOTE  Nathan WEISSINGER DUK:[id number] DOB: [birth year]   PCP: Karleen Hampshire., MD  Patient is from: Home.  Lives with his wife who is also admitted for COVID-19 infection.  DOA: 05/08/2019 LOS: 3  Brief Narrative / Interim history: 86 year old male with history of HTN, sick sinus syndrome s/p PPM, A. fib on Eliquis, Parkinson's disease, HTN and hypothyroidism who was sent from urgent care with generalized weakness and hypotension.    In ED, tested positive for COVID-19.  Vitals within normal orthostatic vital positive.  No hypoxia.  CXR with bibasilar opacities.  Inflammatory markers elevated.  Procalcitonin negative.  Admitted for COVID-19 pneumonia, generalized weakness and orthostatic hypotension.  Started on remdesivir.   Subjective: No major events overnight of this morning.  No complaints.  Patient declines disposition to SNF and prefers to go home with home health.  Wife who is also hospitalized with COVID-19 echoes the same.  He denies chest pain, dyspnea, GI or UTI symptoms.  Objective: Vitals:   05/10/19 2017 05/11/19 0658 05/11/19 0937 05/11/19 1300  BP: 137/87 (!) 146/85  (!) 104/99  Pulse: 70 69  88  Resp: 19 18  18   Temp: (!) 97.3 F (36.3 C) (!) 97.5 F (36.4 C)  98.6 F (37 C)  TempSrc: Oral Oral  Oral  SpO2: 98% 94% 97% 96%  Weight:      Height:        Intake/Output Summary (Last 24 hours) at 05/11/2019 1439 Last data filed at 05/11/2019 0927 Gross per 24 hour  Intake 380 ml  Output 1200 ml  Net -820 ml   Filed Weights   05/08/19 1259 05/09/19 1408  Weight: 69.9 kg 73.2 kg    Examination:  GENERAL: No acute distress.  Appears well.  HEENT: MMM.  Vision and hearing grossly intact.  NECK: Supple.  No apparent JVD.  RESP: On RA.  No IWOB.  Poor aeration partly due to poor respiratory effort. CVS:  RRR. Heart sounds normal.  ABD/GI/GU: Bowel sounds present. Soft. Non tender.  MSK/EXT:  Moves extremities. No apparent deformity. No edema.  SKIN: no  apparent skin lesion or wound NEURO: Awake, alert and oriented appropriately.  No apparent focal neuro deficit. PSYCH: Calm. Normal affect.  Procedures:  None  Assessment & Plan: Acute COVID-19 infection: CXR with bibasilar opacities.  CRP elevated to 2.3 markers within normal.  Pro-Cal negative.  No respiratory distress or oxygen requirement.  Ambulated on RA and maintained appropriate saturation. Recent Labs    05/09/19 0515 05/10/19 0336 05/11/19 0324  DDIMER 0.29 0.41 0.41  FERRITIN 231 246 240  CRP 2.3* 1.8* 1.3*  -Continue remdesivir 1/5>> -Already on Eliquis for A. fib. -Monitor inflammatory markers -Supportive care with Tylenol, vitamin C and zinc.  Orthostatic hypotension: patient presented with generalized weakness and dizziness. Could have autonomic dysregulation due to PD, iatrogenic and COVID-19 infection.  Orthostatic vitals continues to be positive. -Discontinue amlodipine-already on Cardizem for A. Fib. -Continue holding Micardis -Recheck orthostatic vitals -Ted hose  Persistent atrial fibrillation: Rate controlled. -Continue Cardizem and Eliquis.  History of sick sinus syndrome s/p PPM  Anemia of chronic disease: H&H stable. -Continue monitoring  BPH without LUTS -Continue home Flomax.  GERD: -Continue PPI.  History of Parkinson's disease/RLS -Continue Requip and Mirapex  Debility/physical deconditioning/generalized weakness -PT/OT recommended SNF or 24/7 supervision but patient adamant about going home with home health.  Patient's wife who is here with Covid infection also echoes the same  DVT prophylaxis: On Eliquis for A. fib. Code Status: Full code Family Communication: Updated patient's son over the phone. Disposition Plan: Anticipate discharge home with home health on 1/9. Consultants: None   Microbiology summarized: COVID-19 positive.    Sch Meds:  Scheduled Meds: . albuterol  2 puff Inhalation Q6H  . apixaban  5 mg  Oral BID  . vitamin C  500 mg Oral Daily  . diltiazem  180 mg Oral Daily  . pantoprazole  40 mg Oral Daily  . rOPINIRole  0.5 mg Oral TID  . tamsulosin  0.4 mg Oral Daily  . zinc sulfate  220 mg Oral Daily   Continuous Infusions: . sodium chloride 100 mL/hr at 05/08/19 1750  . sodium chloride 1,000 mL (05/11/19 1116)  . remdesivir 100 mg in NS 100 mL 100 mg (05/11/19 1119)   PRN Meds:.sodium chloride, guaiFENesin-dextromethorphan, LORazepam, magnesium hydroxide  Antimicrobials: Anti-infectives (From admission, onward)   Start     Dose/Rate Route Frequency Ordered Stop   05/09/19 1000  remdesivir 100 mg in sodium chloride 0.9 % 100 mL IVPB  Status:  Discontinued     100 mg 200 mL/hr over 30 Minutes Intravenous Daily 05/08/19 2156 05/08/19 2211   05/09/19 1000  remdesivir 100 mg in sodium chloride 0.9 % 100 mL IVPB     100 mg 200 mL/hr over 30 Minutes Intravenous Daily 05/08/19 1556 05/13/19 0959   05/08/19 2156  remdesivir 200 mg in sodium chloride 0.9% 250 mL IVPB  Status:  Discontinued     200 mg 580 mL/hr over 30 Minutes Intravenous Once 05/08/19 2156 05/08/19 2211   05/08/19 1630  remdesivir 200 mg in sodium chloride 0.9% 250 mL IVPB     200 mg 580 mL/hr over 30 Minutes Intravenous Once 05/08/19 1556 05/08/19 1932      I have personally reviewed the following labs and images: CBC: Recent Labs  Lab 05/08/19 1259 05/09/19 0515 05/10/19 0336 05/11/19 0324  WBC 4.2 3.5* 4.2 4.3  NEUTROABS 3.1 2.3 3.0 3.2  HGB 10.7* 10.7* 10.7* 10.5*  HCT 33.2* 32.8* 33.2* 32.1*  MCV 95.7 94.3 95.1 93.9  PLT 149* 159 169 172   BMP &GFR Recent Labs  Lab 05/08/19 1259 05/09/19 0515 05/10/19 0336 05/11/19 0324  NA 137 137 133* 133*  K 4.7 4.4 4.0 4.1  CL 102 102 98 100  CO2 27 24 25 24   GLUCOSE 100* 84 92 95  BUN 19 16 16 20   CREATININE 1.09 0.86 0.76 0.83  CALCIUM 8.4* 8.7* 8.4* 8.4*   Estimated Creatinine Clearance: 65.1 mL/min (by C-G formula based on SCr of 0.83  mg/dL). Liver & Pancreas: Recent Labs  Lab 05/08/19 1259 05/09/19 0515 05/10/19 0336 05/11/19 0324  AST 17 17 16 20   ALT 14 14 13 12   ALKPHOS 76 77 67 70  BILITOT 1.1 0.7 0.8 0.7  PROT 6.3* 6.2* 5.9* 6.1*  ALBUMIN 3.7 3.3* 3.1* 3.2*   No results for input(s): LIPASE, AMYLASE in the last 168 hours. No results for input(s): AMMONIA in the last 168 hours. Diabetic: No results for input(s): HGBA1C in the last 72 hours. No results for input(s): GLUCAP in the last 168 hours. Cardiac Enzymes: No results for input(s): CKTOTAL, CKMB, CKMBINDEX, TROPONINI in the last 168 hours. No results for input(s): PROBNP in the last 8760 hours. Coagulation Profile: Recent Labs  Lab 05/08/19 1259  INR 1.6*   Thyroid Function Tests: No results for input(s): TSH, T4TOTAL, FREET4, T3FREE, THYROIDAB in the  last 72 hours. Lipid Profile: No results for input(s): CHOL, HDL, LDLCALC, TRIG, CHOLHDL, LDLDIRECT in the last 72 hours. Anemia Panel: Recent Labs    05/10/19 0336 05/11/19 0324  FERRITIN 246 240   Urine analysis:    Component Value Date/Time   COLORURINE YELLOW 05/08/2019 1259   APPEARANCEUR CLEAR 05/08/2019 1259   LABSPEC 1.006 05/08/2019 1259   PHURINE 7.0 05/08/2019 1259   GLUCOSEU NEGATIVE 05/08/2019 1259   HGBUR SMALL (A) 05/08/2019 1259   BILIRUBINUR NEGATIVE 05/08/2019 1259   KETONESUR NEGATIVE 05/08/2019 1259   PROTEINUR NEGATIVE 05/08/2019 1259   NITRITE NEGATIVE 05/08/2019 1259   LEUKOCYTESUR NEGATIVE 05/08/2019 1259   Sepsis Labs: Invalid input(s): PROCALCITONIN, LACTICIDVEN  Microbiology: Recent Results (from the past 240 hour(s))  Urine culture     Status: None   Collection Time: 05/08/19 12:59 PM   Specimen: Urine, Random  Result Value Ref Range Status   Specimen Description   Final    URINE, RANDOM Performed at [facility], [address] [place], [place] [address]    Special Requests   Final    NONE Performed at [facility], [address], [place], [place] [address]    Culture   Final    NO GROWTH Performed at [facility], [address] [place], [place] [address]    Report Status 05/09/2019 FINAL  Final  Blood Culture (routine x 2)     Status: None (Preliminary result)   Collection Time: 05/08/19  2:51 PM   Specimen: BLOOD  Result Value Ref Range Status   Specimen Description   Final    BLOOD LEFT ANTECUBITAL Performed at [facility], [address] [place], [place] [address]    Special Requests   Final    BOTTLES DRAWN AEROBIC AND ANAEROBIC Blood Culture adequate volume Performed at [facility], [address] [place], [place] [address]    Culture   Final    NO GROWTH 3 DAYS Performed at [facility], [address] [place], [place] [address]    Report Status PENDING  Incomplete  Blood Culture (routine x 2)     Status: None (Preliminary result)   Collection Time: 05/09/19  5:15 AM   Specimen: BLOOD LEFT HAND  Result Value Ref Range Status   Specimen Description   Final    BLOOD LEFT HAND Performed at [facility], [address] [place], [place] [address]    Special Requests   Final    BOTTLES DRAWN AEROBIC AND ANAEROBIC Blood Culture adequate volume Performed at [facility], [address], [place], [place] [address]    Culture   Final    NO GROWTH 2 DAYS Performed at [facility], [address], [place], [place] [address]    Report Status PENDING  Incomplete    Radiology Studies: No results found.  Judithe Keetch T. Teofilo Lupinacci [facility]  If 7PM-7AM, please contact night-coverage [email or web address] Password [facility] 05/11/2019, 2:39 PM

## 2019-05-11 NOTE — TOC Progression Note (Addendum)
Transition of Care [facility]) - Progression Note    Patient Details  Name: Nathan DELAUGHTER MRN: [id number] Date of Birth: 10-15-1931  Transition of Care [facility]) CM/SW Contact  Geni Bers, RN Phone Number: 05/11/2019, 9:53 AM  Clinical Narrative:     Pt is now going home with [facility]. In house rep aware. Pt's wife selected [facility] for pt \.        Expected Discharge Plan and Services           Expected Discharge Date: 05/10/19                                     Social Determinants of Health (SDOH) Interventions    Readmission Risk Interventions No flowsheet data found.

## 2019-05-11 NOTE — Care Management Important Message (Signed)
Important Message  Patient Details IM Letter given to Windell Moulding SW Case Manager to present to the Patient Name: Nathan Vazquez MRN: [id number] Date of Birth: Oct 27, 1931   Medicare Important Message Given:  Yes     Caren Macadam 05/11/2019, 11:52 AM

## 2019-05-12 DIAGNOSIS — I4821 Permanent atrial fibrillation: Secondary | ICD-10-CM

## 2019-05-12 LAB — COMPREHENSIVE METABOLIC PANEL
ALT: 17 U/L (ref 0–44)
AST: 21 U/L (ref 15–41)
Albumin: 3.3 g/dL — ABNORMAL LOW (ref 3.5–5.0)
Alkaline Phosphatase: 70 U/L (ref 38–126)
Anion gap: 11 (ref 5–15)
BUN: 21 mg/dL (ref 8–23)
CO2: 23 mmol/L (ref 22–32)
Calcium: 8.9 mg/dL (ref 8.9–10.3)
Chloride: 102 mmol/L (ref 98–111)
Creatinine, Ser: 0.92 mg/dL (ref 0.61–1.24)
GFR calc Af Amer: >60 mL/min (ref >60)
GFR calc non Af Amer: >60 mL/min (ref >60)
Glucose, Bld: 89 mg/dL (ref 70–99)
Potassium: 4.3 mmol/L (ref 3.5–5.1)
Sodium: 136 mmol/L (ref 135–145)
Total Bilirubin: 0.8 mg/dL (ref 0.3–1.2)
Total Protein: 6.2 g/dL — ABNORMAL LOW (ref 6.5–8.1)

## 2019-05-12 LAB — CBC WITH DIFFERENTIAL/PLATELET
Abs Immature Granulocytes: 0.03 10*3/uL (ref 0.00–0.07)
Basophils Absolute: 0 10*3/uL (ref 0.0–0.1)
Basophils Relative: 0 %
Eosinophils Absolute: 0 10*3/uL (ref 0.0–0.5)
Eosinophils Relative: 1 %
HCT: 32.8 % — ABNORMAL LOW (ref 39.0–52.0)
Hemoglobin: 10.6 g/dL — ABNORMAL LOW (ref 13.0–17.0)
Immature Granulocytes: 1 %
Lymphocytes Relative: 17 %
Lymphs Abs: 0.8 10*3/uL (ref 0.7–4.0)
MCH: 30.5 pg (ref 26.0–34.0)
MCHC: 32.3 g/dL (ref 30.0–36.0)
MCV: 94.3 fL (ref 80.0–100.0)
Monocytes Absolute: 0.5 10*3/uL (ref 0.1–1.0)
Monocytes Relative: 10 %
Neutro Abs: 3.4 10*3/uL (ref 1.7–7.7)
Neutrophils Relative %: 71 %
Platelets: 189 10*3/uL (ref 150–400)
RBC: 3.48 MIL/uL — ABNORMAL LOW (ref 4.22–5.81)
RDW: 12.4 % (ref 11.5–15.5)
WBC: 4.8 10*3/uL (ref 4.0–10.5)
nRBC: 0 % (ref 0.0–0.2)

## 2019-05-12 LAB — D-DIMER, QUANTITATIVE: D-Dimer, Quant: 0.33 ug/mL-FEU (ref 0.00–0.50)

## 2019-05-12 LAB — FERRITIN: Ferritin: 261 ng/mL (ref 24–336)

## 2019-05-12 LAB — C-REACTIVE PROTEIN: CRP: 1.3 mg/dL — ABNORMAL HIGH (ref <1.0)

## 2019-05-12 NOTE — TOC Progression Note (Signed)
Transition of Care [facility]) - Progression Note    Patient Details  Name: Nathan Vazquez MRN: [id number] Date of Birth: [birth year]  Transition of Care [facility]) CM/SW Contact  Armanda Heritage, RN Phone Number: 05/12/2019, 11:01 AM  Clinical Narrative:    [employer] to deliver 3-in-1 to bedside for home use.   Expected Discharge Plan: Home w Home Health Services Barriers to Discharge: No Barriers Identified  Expected Discharge Plan and Services Expected Discharge Plan: Home w Home Health Services   Discharge Planning Services: CM Consult   Living arrangements for the past 2 months: Single Family Home Expected Discharge Date: 05/12/19               DME Arranged: 3-N-1 DME Agency: [employer] Date DME Agency Contacted: 05/12/19 Time DME Agency Contacted: 1100 Representative spoke with at DME Agency: [place]             Social Determinants of Health (SDOH) Interventions    Readmission Risk Interventions No flowsheet data found.

## 2019-05-12 NOTE — Progress Notes (Signed)
AVS given to patient and explained at the bedside and explained over the phone with the pt's son. Medications and follow up appointments have been explained with pt and pt's son verbalizing understanding.

## 2019-05-12 NOTE — Discharge Summary (Signed)
Physician Discharge Summary  Nathan SarnaConrad L Vazquez VQM:[id number] DOB: July 09, 1931 DOA: 05/08/2019  PCP: Laqueta DueFurr, Sara M., MD  Admit date: 05/08/2019 Discharge date: 05/12/2019  Admitted From: Home Disposition: Home  Recommendations for Outpatient Follow-up:  1. Follow ups as below. 2. Please obtain CBC/BMP/Mag at follow up 3. Please follow up on the following pending results: None  Home Health: PT/OT/RN Equipment/Devices: 3 in 1 commode  Discharge Condition: Stable CODE STATUS: Full code  Follow-up Information    Laqueta DueFurr, Sara M., MD. Schedule an appointment as soon as possible for a visit in 2 week(s).   Specialty: Internal Medicine Contact information: [address] [place] [id number] [phone]           Hospital Course: 86 year old male with history of HTN, sick sinus syndrome s/p PPM, A. fib on Eliquis, Parkinson's disease, HTN and hypothyroidism who was sent from urgent care with generalized weakness and hypotension.    In ED, tested positive for COVID-19.  Vitals within normal orthostatic vital positive.  No hypoxia.  CXR with bibasilar opacities.  Inflammatory markers elevated.  Procalcitonin negative.  Admitted for COVID-19 pneumonia, generalized weakness and orthostatic hypotension.  Started on remdesivir.   Patient completed 5 days course of remdesivir.  Did not require steroid or Actemra.  Has not had any respiratory issues.  Weakness and orthostatic hypotension improved with adjustment of his blood pressure medications and TED hose.   On the day of discharge, patient ambulated on room air maintain saturation greater than 96% without distress.  Orthostatic vitals remained positive but with less symptoms.  Evaluated by PT/OT who recommended SNF but patient declined SNF and chose to go home with home health.   Plan discussed with patient's son and wife.  See individual problem list below for more hospital course.    Discharge Diagnoses:  Acute  COVID-19 infection: CXR with bibasilar opacities.  CRP elevated to 2.3 markers within normal.  Pro-Cal negative.  No respiratory distress or oxygen requirement.  Ambulated on RA and maintained appropriate saturation without distress.  Recent Labs    05/10/19 0336 05/11/19 0324 05/12/19 0331  DDIMER 0.41 0.41 0.33  FERRITIN 246 240 261  CRP 1.8* 1.3* 1.3*  -Remdesivir 1/5-1/9.  Did not need a steroid. -Already on Eliquis for A. fib.  Orthostatic hypotension: patient presented with generalized weakness and dizziness. Could have autonomic dysregulation due to PD, iatrogenic and COVID-19 infection.  Orthostatic vitals positive but less symptomatic. -Discontinued amlodipine and Micardis -Compression socks -Discussed about fall precautions and a stepwise approach to get and go -PT/OT eval  Persistent atrial fibrillation: Rate controlled. -Continue Cardizem and Eliquis.  History of sick sinus syndrome s/p PPM  Anemia of chronic disease: H&H stable.  BPH without LUTS -Continue home Flomax.  GERD: -Continue PPI.  History of Parkinson's disease/RLS -Continue home meds  Debility/physical deconditioning/generalized weakness: PT/OT recommended SNF or 24/7 supervision but patient adamant about going home with home health.  Patient's wife who has been hospitalized with COVID-19 infection concurs with him. -Discharged with home health PT/OT/RN and bedside commode.  Discharge Instructions  Discharge Instructions    Call MD for:  difficulty breathing, headache or visual disturbances   Complete by: As directed    Call MD for:  extreme fatigue   Complete by: As directed    Call MD for:  persistant dizziness or light-headedness   Complete by: As directed    Call MD for:  persistant nausea and vomiting   Complete by: As directed  Diet - low sodium heart healthy   Complete by: As directed    Discharge instructions   Complete by: As directed    It has been a pleasure taking  care of you! You were hospitalized and treated for COVID-19 infection. You will receive more [facility] the next 3 days.  Someone will contact you to schedule this.  You are potentially infectious for the next 10-15 days. We recommend you isolate yourself and take the necessary precautions to prevent the virus from spreading.  Some of the steps to prevent the virus from spreading to others: Stay away from other and members of your household at least for 10-15 days. Let healthy household members care for children and pets, if possible. If you have to care for children or pets, wash your hands often and wear a mask. If possible, stay in your own room, separate from others. Use a different bathroom.Make sure that all people in your household wash their hands well and often. Leave your house only to seek medical care. Do not use public transport. Do not travel while you are sick. Wash your hands often with soap and water for 20 seconds. If soap and water are not available, use alcohol-based hand sanitizer. Cough or sneeze into a tissue or your sleeve or elbow. Do not cough or sneeze into your hand or into the air. Wear a cloth face covering or face mask.  Return precautions: Get help or return to the hospital right away if: You have trouble breathing. You have pain or pressure in your chest. You have confusion. You have bluish lips and fingernails. You have difficulty waking from sleep. You have symptoms that get worse. These symptoms may represent a serious problem that is an emergency. Do not wait to see if the symptoms will go away. Get medical help right away. Call your local emergency services (911 in the [place] Do not drive yourself to the hospital. Let the emergency medical personnel know if you think you have COVID-19.  To protect yourself in the future:  Do not travel to areas where COVID-19 is a risk. The areas where COVID-19  is reported change often. To identify high-risk areas and travel restrictions, check the CDC travel website: StageSync.siwwwnc.cdc.gov/travel/notices If you live in, or must travel to, an area where COVID-19 is a risk, take precautions to avoid infection. Stay away from people who are sick. Wash your hands often with soap and water for 20 seconds. If soap and water are not available, use an alcohol-based hand sanitizer. Avoid touching your mouth, face, eyes, or nose. Avoid going out in public, follow guidance from your state and local health authorities. If you must go out in public, wear a cloth face covering or face mask. Disinfect objects and surfaces that are frequently touched every day. This may include: Counters and tables. Doorknobs and light switches. Sinks and faucets. Electronics, such as phones, remote controls, keyboards, computers, and tablets.    Where to find more information Centers for Disease Control and Prevention: StickerEmporium.tnwww.cdc.gov/coronavirus/2019-ncov/index.html World Health Organization: https://thompson-craig.com/www.who.int/health-topics/coronavirus   Discharge instructions   Complete by: As directed    It has been a pleasure taking care of you! You were hospitalized with generalized weakness and low blood pressure, and tested positive for COVID-19 infection.  You were treated for COVID-19 infection.  We have stopped some of the blood pressure medications which could contribute to your home low blood pressure and weakness. Please review  your new medication list and the directions before you take your medications.  We also encourage you to wear the compression socks during the time except at night when you sleep. We encourage you to use a stepwise approach when you get up and go. You will have home health therapist to come to your house for therapeutic exercise to help with your strength and balance.  In regards to COVID-19 infection, you are potentially infectious for the next 10-15 days. We recommend you  isolate yourself and take the necessary precautions to prevent the virus from spreading.  Some of the steps to prevent the virus from spreading to others: Stay away from other and members of your household at least for 10-15 days. Let healthy household members care for children and pets, if possible. If you have to care for children or pets, wash your hands often and wear a mask. If possible, stay in your own room, separate from others. Use a different bathroom.Make sure that all people in your household wash their hands well and often. Leave your house only to seek medical care. Do not use public transport. Do not travel while you are sick. Wash your hands often with soap and water for 20 seconds. If soap and water are not available, use alcohol-based hand sanitizer. Cough or sneeze into a tissue or your sleeve or elbow. Do not cough or sneeze into your hand or into the air. Wear a cloth face covering or face mask.  Return precautions: Get help or return to the hospital right away if: You have trouble breathing. You have pain or pressure in your chest. You have confusion. You have bluish lips and fingernails. You have difficulty waking from sleep. You have symptoms that get worse. These symptoms may represent a serious problem that is an emergency. Do not wait to see if the symptoms will go away. Get medical help right away. Call your local emergency services (911 in the [place] Do not drive yourself to the hospital. Let the emergency medical personnel know if you think you have COVID-19.  To protect yourself in the future:  Do not travel to areas where COVID-19 is a risk. The areas where COVID-19 is reported change often. To identify high-risk areas and travel restrictions, check the CDC travel website: StageSync.si If you live in, or must travel to, an area where COVID-19 is a risk, take precautions to avoid infection. Stay away from people who are sick. Wash your hands  often with soap and water for 20 seconds. If soap and water are not available, use an alcohol-based hand sanitizer. Avoid touching your mouth, face, eyes, or nose. Avoid going out in public, follow guidance from your state and local health authorities. If you must go out in public, wear a cloth face covering or face mask. Disinfect objects and surfaces that are frequently touched every day. This may include: Counters and tables. Doorknobs and light switches. Sinks and faucets. Electronics, such as phones, remote controls, keyboards, computers, and tablets.    Where to find more information Centers for Disease Control and Prevention: StickerEmporium.tn World Health Organization: https://thompson-craig.com/   Increase activity slowly   Complete by: As directed    Increase activity slowly   Complete by: As directed    MyChart COVID-19 home monitoring program   Complete by: May 12, 2019    Is the patient willing to use the MyChart Mobile App for home monitoring?: No     Allergies as of 05/12/2019  Reactions   Penicillin G Other (See Comments)   Pt unable to recall reaction Pt unable to recall reaction      Medication List    STOP taking these medications   amLODipine 2.5 MG tablet Commonly known as: NORVASC   rOPINIRole 0.5 MG tablet Commonly known as: REQUIP   telmisartan 80 MG tablet Commonly known as: MICARDIS     TAKE these medications   diltiazem 180 MG 24 hr capsule Commonly known as: CARDIZEM CD Take 180 mg by mouth daily.   Eliquis 5 MG Tabs tablet Generic drug: apixaban Take 5 mg by mouth 2 (two) times daily.   geriatric multivitamins-minerals Liqd Take 15 mLs by mouth 3 (three) times daily with meals.   LORazepam 0.5 MG tablet Commonly known as: ATIVAN Take 0.5 mg by mouth every 8 (eight) hours as needed for agitation or anxiety.   omeprazole 40 MG capsule Commonly known as: PRILOSEC Take 40 mg by mouth daily.    ondansetron 4 MG disintegrating tablet Commonly known as: ZOFRAN-ODT Take 4 mg by mouth every 8 (eight) hours as needed for nausea/vomiting.   pramipexole 0.125 MG tablet Commonly known as: MIRAPEX Take 0.125 mg by mouth 3 (three) times daily.   tamsulosin 0.4 MG Caps capsule Commonly known as: FLOMAX Take 0.4 mg by mouth daily.   triamcinolone 0.025 % cream Commonly known as: KENALOG Apply 1 application topically 2 (two) times daily as needed (rash).   Vitamin D (Ergocalciferol) 1.25 MG (50000 UT) Caps capsule Commonly known as: DRISDOL Take 50,000 Units by mouth once a week.            Durable Medical Equipment  (From admission, onward)         Start     Ordered   05/12/19 1103  For home use only DME Bedside commode  Once    Comments: DME 3 in 1 commode  Question:  Patient needs a bedside commode to treat with the following condition  Answer:  Unsteady gait   05/12/19 1102          Consultations:  None  Procedures/Studies:  DG Chest Port 1 View  Result Date: 05/08/2019 CLINICAL DATA:  Fatigue and weakness for the past 3 weeks. COVID exposure. EXAM: PORTABLE CHEST 1 VIEW COMPARISON:  Chest x-ray dated October 24, 2018. FINDINGS: Unchanged left chest wall pacemaker. Stable mild cardiomegaly. Atherosclerotic calcification of the aortic arch. Normal pulmonary vascularity. Minimal bibasilar opacities. No pleural effusion or pneumothorax. No acute osseous abnormality. IMPRESSION: 1. Minimal bibasilar opacities which could reflect atelectasis or pneumonia, including atypical infection. Electronically Signed   By: Obie Dredge M.D.   On: 05/08/2019 14:35      Discharge Exam: Vitals:   05/12/19 0600 05/12/19 0854  BP:    Pulse:    Resp: 16   Temp: 98.8 F (37.1 C)   SpO2: 94% 96%    GENERAL: No acute distress.  Appears well.  HEENT: MMM.  Vision and hearing grossly intact.  NECK: Supple.  No apparent JVD.  RESP:  No IWOB.  Fair aeration bilaterally. CVS:  Irregular rhythm.  Normal rate. Heart sounds normal.  ABD/GI/GU: Bowel sounds present. Soft. Non tender.  MSK/EXT:  Moves extremities. No apparent deformity or edema.  SKIN: no apparent skin lesion or wound NEURO: Awake, alert and oriented appropriately.  No apparent focal neuro deficit. PSYCH: Calm. Normal affect.   The results of significant diagnostics from this hospitalization (including imaging, microbiology, ancillary and laboratory) are listed below  for reference.     Microbiology: Recent Results (from the past 240 hour(s))  Urine culture     Status: None   Collection Time: 05/08/19 12:59 PM   Specimen: Urine, Random  Result Value Ref Range Status   Specimen Description   Final    URINE, RANDOM Performed at [facility], [address] [place], [place] [address]    Special Requests   Final    NONE Performed at [facility], [address] [place], [place] [address]    Culture   Final    NO GROWTH Performed at [facility], [address], [place], [place] [address]    Report Status 05/09/2019 FINAL  Final  Blood Culture (routine x 2)     Status: None (Preliminary result)   Collection Time: 05/08/19  2:51 PM   Specimen: BLOOD  Result Value Ref Range Status   Specimen Description   Final    BLOOD LEFT ANTECUBITAL Performed at [facility], [address] [place], [place] [address]    Special Requests   Final    BOTTLES DRAWN AEROBIC AND ANAEROBIC Blood Culture adequate volume Performed at [facility], [address], [place], [place] [address]    Culture   Final    NO GROWTH 4 DAYS Performed at [facility], [address], [place], [place] [address]    Report Status PENDING  Incomplete  Blood Culture (routine x 2)     Status: None (Preliminary result)   Collection Time: 05/09/19  5:15 AM   Specimen: BLOOD LEFT HAND  Result Value Ref Range Status   Specimen Description    Final    BLOOD LEFT HAND Performed at [facility], [address] [place], [place] [address]    Special Requests   Final    BOTTLES DRAWN AEROBIC AND ANAEROBIC Blood Culture adequate volume Performed at [facility], [address] [place], [place] [address]    Culture   Final    NO GROWTH 3 DAYS Performed at [facility], [address], [place], [place] [address]    Report Status PENDING  Incomplete     Labs: BNP (last 3 results) No results for input(s): BNP in the last 8760 hours. Basic Metabolic Panel: Recent Labs  Lab 05/08/19 1259 05/09/19 0515 05/10/19 0336 05/11/19 0324 05/12/19 0331  NA 137 137 133* 133* 136  K 4.7 4.4 4.0 4.1 4.3  CL 102 102 98 100 102  CO2 27 24 25 24 23   GLUCOSE 100* 84 92 95 89  BUN 19 16 16 20 21   CREATININE 1.09 0.86 0.76 0.83 0.92  CALCIUM 8.4* 8.7* 8.4* 8.4* 8.9   Liver Function Tests: Recent Labs  Lab 05/08/19 1259 05/09/19 0515 05/10/19 0336 05/11/19 0324 05/12/19 0331  AST 17 17 16 20 21   ALT 14 14 13 12 17   ALKPHOS 76 77 67 70 70  BILITOT 1.1 0.7 0.8 0.7 0.8  PROT 6.3* 6.2* 5.9* 6.1* 6.2*  ALBUMIN 3.7 3.3* 3.1* 3.2* 3.3*   No results for input(s): LIPASE, AMYLASE in the last 168 hours. No results for input(s): AMMONIA in the last 168 hours. CBC: Recent Labs  Lab 05/08/19 1259 05/09/19 0515 05/10/19 0336 05/11/19 0324 05/12/19 0331  WBC 4.2 3.5* 4.2 4.3 4.8  NEUTROABS 3.1 2.3 3.0 3.2 3.4  HGB 10.7* 10.7* 10.7* 10.5* 10.6*  HCT 33.2* 32.8* 33.2* 32.1* 32.8*  MCV 95.7 94.3 95.1 93.9 94.3  PLT 149* 159 169 172 189   Cardiac Enzymes: No results for input(s): CKTOTAL, CKMB, CKMBINDEX, TROPONINI in the last 168 hours. BNP: Invalid input(s): POCBNP CBG: No results for input(s): GLUCAP in the last 168 hours. D-Dimer Recent Labs    05/11/19 0324 05/12/19 0331  DDIMER 0.41 0.33   Hgb A1c No results for input(s): HGBA1C in the last 72 hours. Lipid Profile No  results for input(s): CHOL, HDL, LDLCALC, TRIG, CHOLHDL, LDLDIRECT in the last 72 hours. Thyroid function studies No results for input(s): TSH, T4TOTAL, T3FREE, THYROIDAB in the last 72 hours.  Invalid input(s): FREET3 Anemia work up Recent Labs    05/11/19 0324 05/12/19 0331  FERRITIN 240 261   Urinalysis    Component Value Date/Time   COLORURINE YELLOW 05/08/2019 1259   APPEARANCEUR CLEAR 05/08/2019 1259   LABSPEC 1.006 05/08/2019 1259   PHURINE 7.0 05/08/2019 1259   GLUCOSEU NEGATIVE 05/08/2019 1259   HGBUR SMALL (A) 05/08/2019 1259   BILIRUBINUR NEGATIVE 05/08/2019 1259   KETONESUR NEGATIVE 05/08/2019 1259   PROTEINUR NEGATIVE 05/08/2019 1259   NITRITE NEGATIVE 05/08/2019 1259   LEUKOCYTESUR NEGATIVE 05/08/2019 1259   Sepsis Labs Invalid input(s): PROCALCITONIN,  WBC,  LACTICIDVEN   Time coordinating discharge: 35 minutes  SIGNED:  Mercy Riding, MD  [facility] 05/12/2019, 3:50 PM  If 7PM-7AM, please contact night-coverage [email or web address] Password [email or web address]

## 2019-05-13 LAB — CULTURE, BLOOD (ROUTINE X 2)
Culture: NO GROWTH
Special Requests: ADEQUATE

## 2019-05-14 LAB — CULTURE, BLOOD (ROUTINE X 2)
Culture: NO GROWTH
Special Requests: ADEQUATE

## 2019-10-04 IMAGING — RF DG ESOPHAGUS
2 series · 11 of 11 positions shown · non-contrast
Comparison: None.

CLINICAL DATA: Oral phase dysphagia.

EXAM:
ESOPHOGRAM/BARIUM SWALLOW
TECHNIQUE: Single contrast examination was performed using  thin barium
FLUOROSCOPY TIME:  Fluoroscopy Time:  1 minutes 24 seconds
Radiation Exposure Index (if provided by the fluoroscopic device):
18 mGy

[Series 1: sequence · 4 of 45 frames shown]
[frame 7/45]
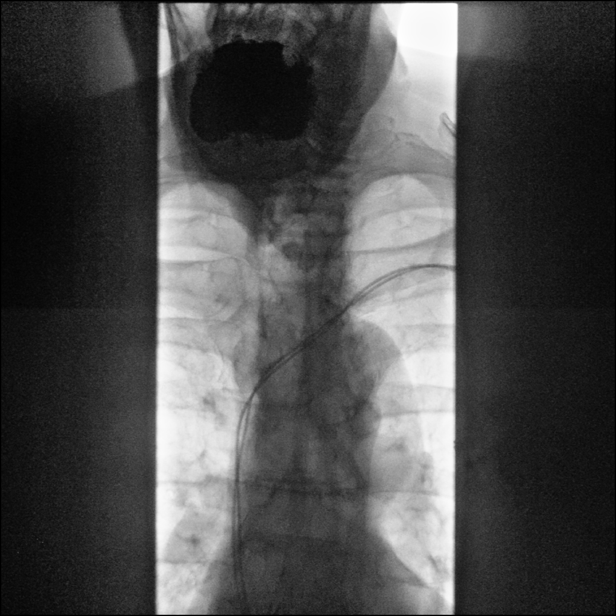
[frame 23/45]
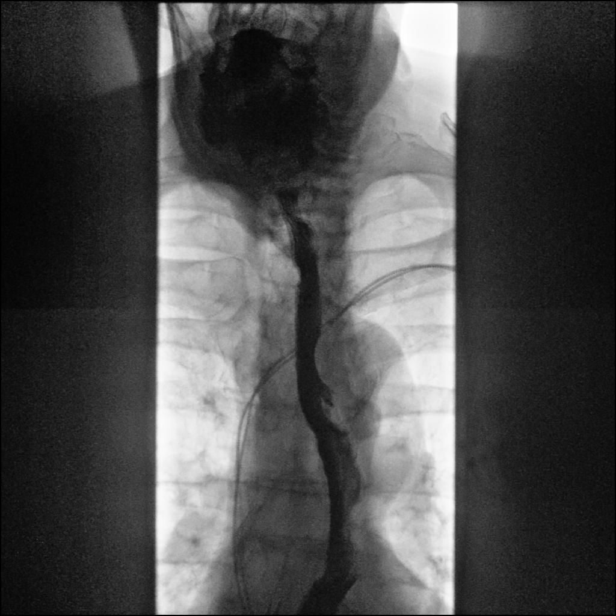
[frame 33/45]
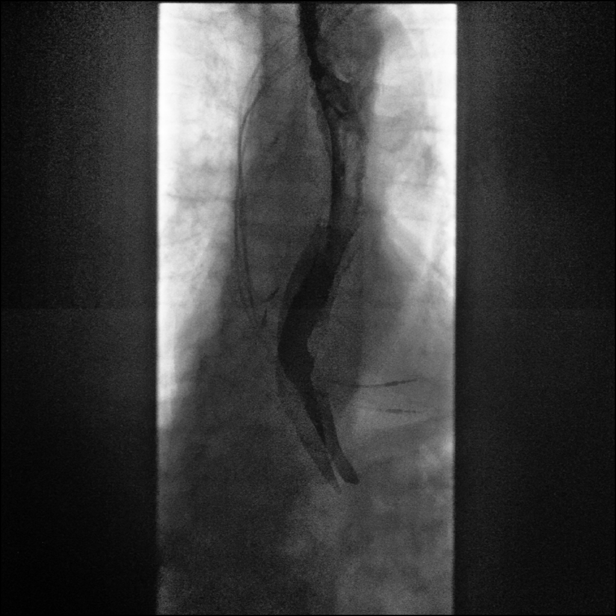
[frame 39/45]
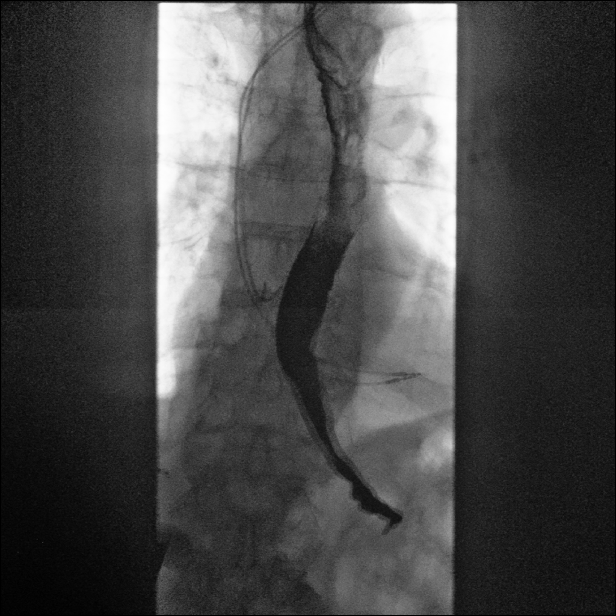

[Series 2: one shot · 0.14mm/px · 7 of 7 slices shown]
[im 1/7]
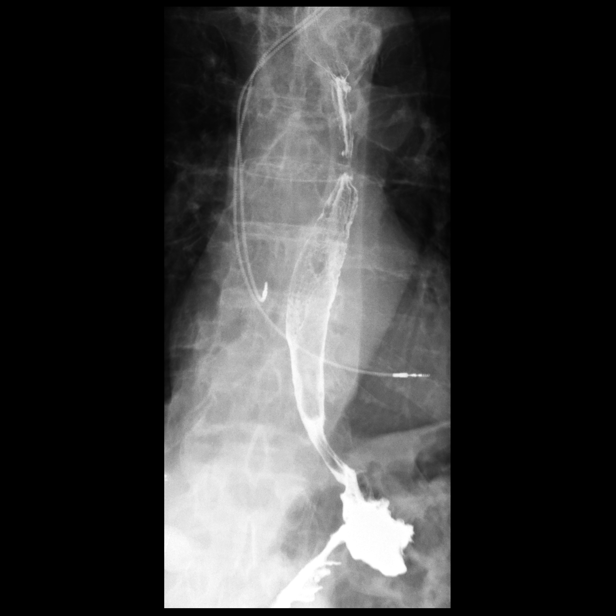
[im 2/7]
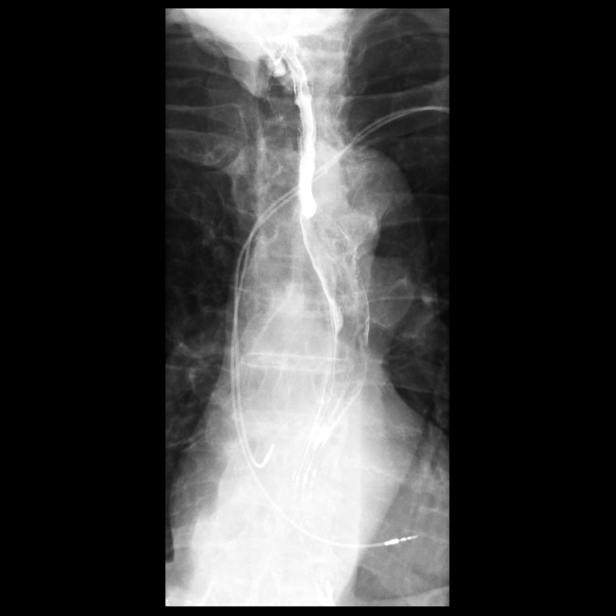
[im 3/7]
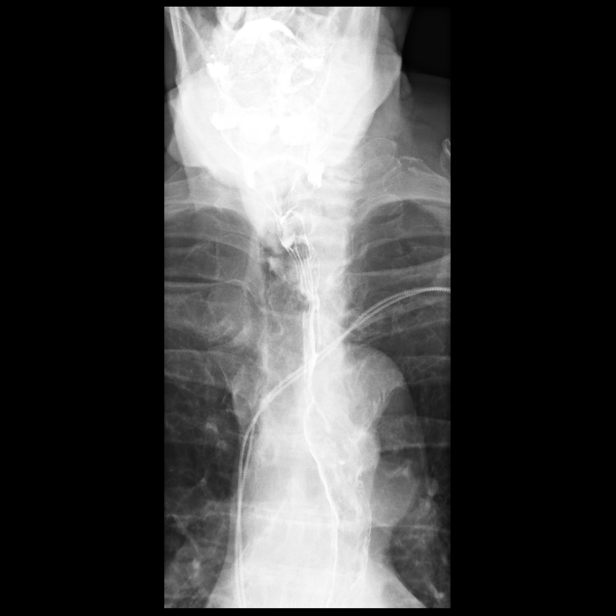
[im 4/7]
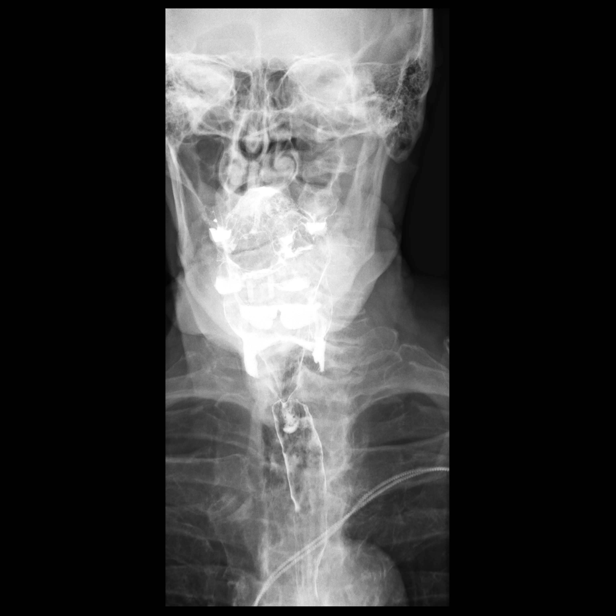
[im 5/7]
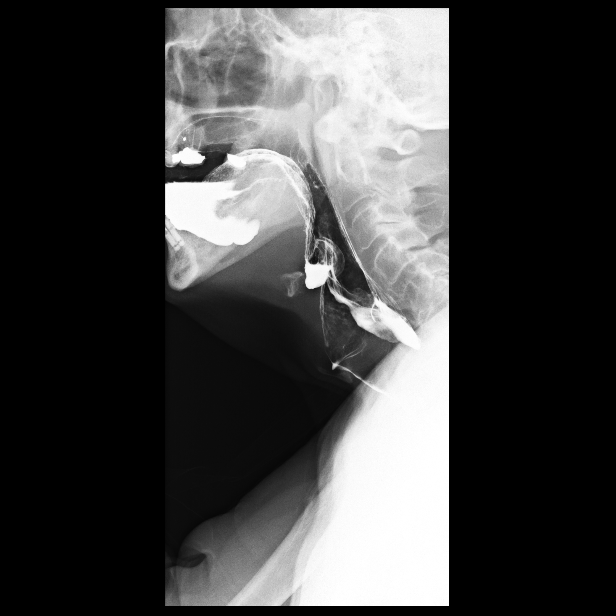
[im 6/7]
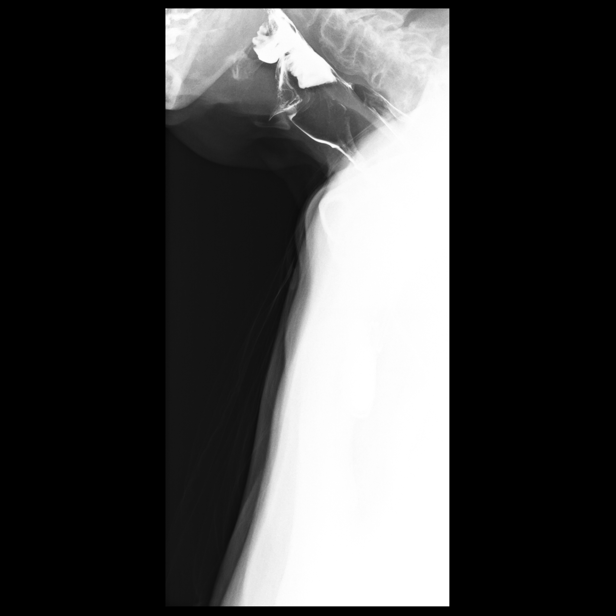
[im 7/7]
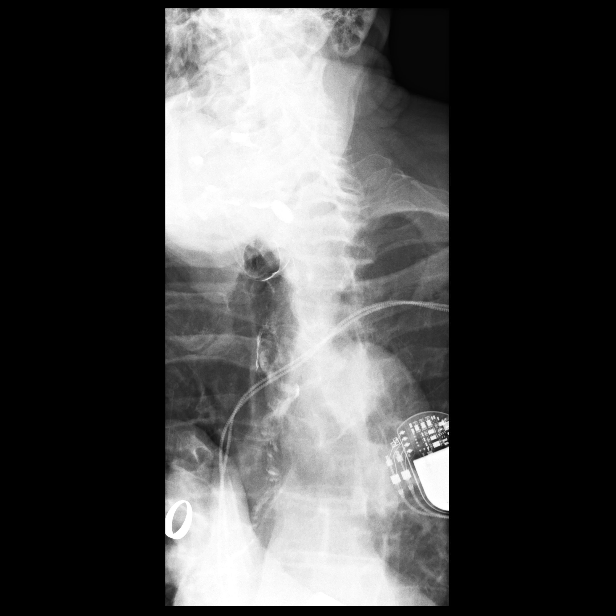

[11 of 11 positions shown; findings below may reference images not displayed]

FINDINGS: The patient had silent aspiration of barium as well as prominent
pooling of barium in the valleculae and piriform sinuses which only
partially cleared on repeat swallowing.

Because of this finding, the study is limited. There is no evidence
of a mass or stricture. No hiatal hernia.

The patient swallowed a 13 mm barium tablet which lodged temporarily
in the left piriform sinus. I had the patient perform a chin tuck
maneuver with swallow and the tablet then passed immediately through
the esophagus into the stomach.
IMPRESSION: 1. Prominent laryngeal penetration and silent aspiration.
2. Prominent pooling in the valleculae and piriform sinuses.
3. The esophagus appears normal.
4. The barium tablet initially lodged in the left piriform sinus but
passed through the esophagus into the stomach after a chin tuck
swallow maneuver.

## 2020-09-24 IMAGING — DX DG CHEST 1V PORT
1 series · 1 of 1 positions shown · non-contrast
Comparison: Chest x-ray dated October 24, 2018.

CLINICAL DATA: Fatigue and weakness for the past 3 weeks. COVID
exposure.

EXAM:
PORTABLE CHEST 1 VIEW

[chest ap]
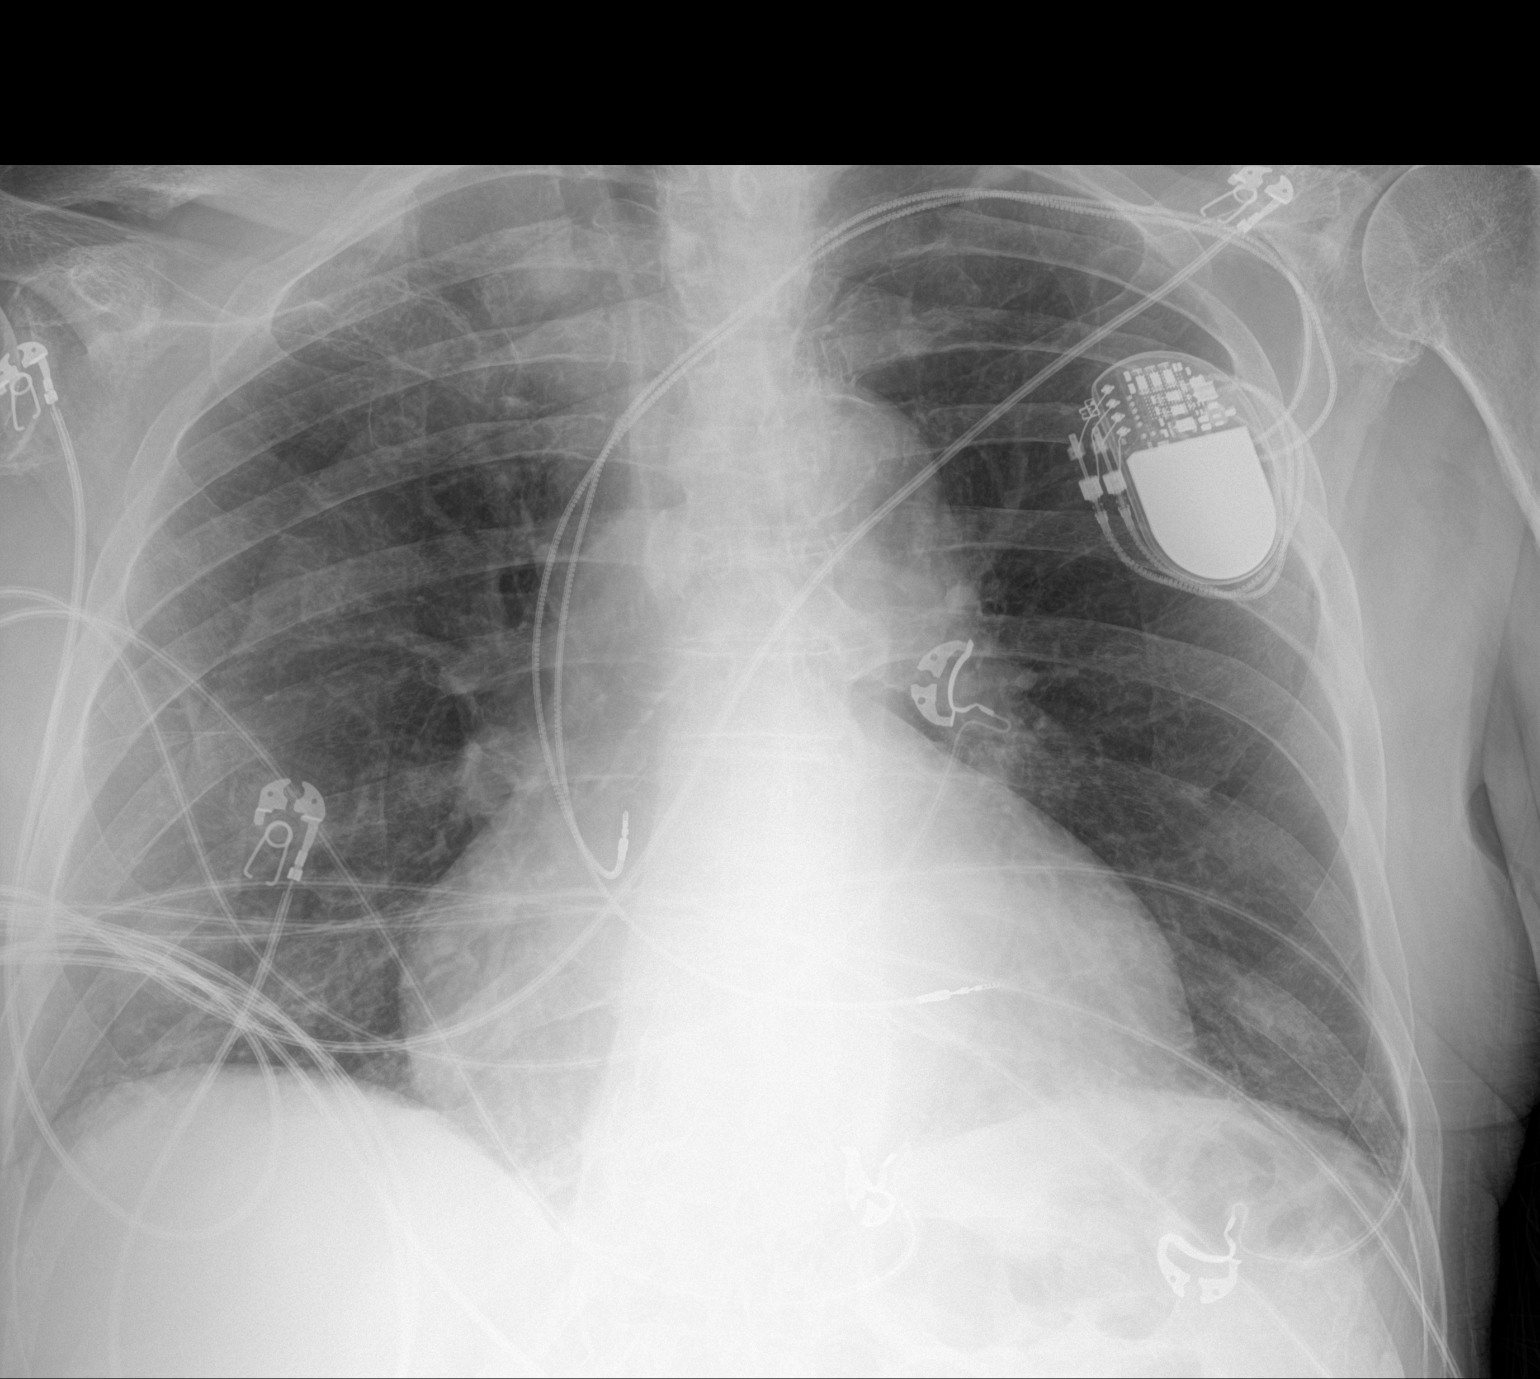

[1 of 1 positions shown; findings below may reference images not displayed]

FINDINGS: Unchanged left chest wall pacemaker. Stable mild cardiomegaly.
Atherosclerotic calcification of the aortic arch. Normal pulmonary
vascularity. Minimal bibasilar opacities. No pleural effusion or
pneumothorax. No acute osseous abnormality.
IMPRESSION: 1. Minimal bibasilar opacities which could reflect atelectasis or
pneumonia, including atypical infection.

## 2021-05-03 DEATH — deceased
